# Patient Record
Sex: Female | Born: 1960 | Race: White | Hispanic: No | Marital: Single | Smoking: Current every day smoker
Health system: Southern US, Community
[De-identification: ages and names within clinical notes are randomized; demographics above are authoritative.]

## PROBLEM LIST (undated history)

## (undated) DIAGNOSIS — K589 Irritable bowel syndrome without diarrhea: Secondary | ICD-10-CM

## (undated) DIAGNOSIS — F410 Panic disorder [episodic paroxysmal anxiety] without agoraphobia: Secondary | ICD-10-CM

## (undated) DIAGNOSIS — K219 Gastro-esophageal reflux disease without esophagitis: Secondary | ICD-10-CM

## (undated) HISTORY — DX: Gastro-esophageal reflux disease without esophagitis: K21.9

## (undated) HISTORY — DX: Irritable bowel syndrome without diarrhea: K58.9

## (undated) HISTORY — PX: DILATION AND CURETTAGE, DIAGNOSTIC / THERAPEUTIC: SUR384

---

## 1998-09-04 ENCOUNTER — Encounter: Payer: Self-pay | Admitting: Obstetrics & Gynecology

## 1998-09-04 ENCOUNTER — Ambulatory Visit (HOSPITAL_COMMUNITY): Admission: RE | Admit: 1998-09-04 | Discharge: 1998-09-04 | Payer: Self-pay | Admitting: Obstetrics & Gynecology

## 1999-03-10 ENCOUNTER — Other Ambulatory Visit: Admission: RE | Admit: 1999-03-10 | Discharge: 1999-03-10 | Payer: Self-pay | Admitting: Obstetrics & Gynecology

## 1999-09-17 ENCOUNTER — Inpatient Hospital Stay (HOSPITAL_COMMUNITY): Admission: AD | Admit: 1999-09-17 | Discharge: 1999-09-19 | Payer: Self-pay | Admitting: Obstetrics & Gynecology

## 2002-01-31 ENCOUNTER — Other Ambulatory Visit: Admission: RE | Admit: 2002-01-31 | Discharge: 2002-01-31 | Payer: Self-pay | Admitting: Obstetrics and Gynecology

## 2003-01-29 ENCOUNTER — Other Ambulatory Visit: Admission: RE | Admit: 2003-01-29 | Discharge: 2003-01-29 | Payer: Self-pay | Admitting: Obstetrics & Gynecology

## 2004-04-03 ENCOUNTER — Other Ambulatory Visit: Admission: RE | Admit: 2004-04-03 | Discharge: 2004-04-03 | Payer: Self-pay | Admitting: Obstetrics & Gynecology

## 2005-04-08 ENCOUNTER — Other Ambulatory Visit: Admission: RE | Admit: 2005-04-08 | Discharge: 2005-04-08 | Payer: Self-pay | Admitting: Obstetrics & Gynecology

## 2016-02-18 ENCOUNTER — Emergency Department (HOSPITAL_BASED_OUTPATIENT_CLINIC_OR_DEPARTMENT_OTHER)
Admission: EM | Admit: 2016-02-18 | Discharge: 2016-02-18 | Disposition: A | Discharge status: Home / Self Care | Payer: 59 | Attending: Emergency Medicine | Admitting: Emergency Medicine

## 2016-02-18 ENCOUNTER — Encounter (HOSPITAL_BASED_OUTPATIENT_CLINIC_OR_DEPARTMENT_OTHER): Payer: Self-pay | Admitting: Emergency Medicine

## 2016-02-18 ENCOUNTER — Emergency Department (HOSPITAL_BASED_OUTPATIENT_CLINIC_OR_DEPARTMENT_OTHER): Payer: 59

## 2016-02-18 DIAGNOSIS — M549 Dorsalgia, unspecified: Secondary | ICD-10-CM | POA: Diagnosis present

## 2016-02-18 DIAGNOSIS — M546 Pain in thoracic spine: Secondary | ICD-10-CM | POA: Diagnosis not present

## 2016-02-18 DIAGNOSIS — F17200 Nicotine dependence, unspecified, uncomplicated: Secondary | ICD-10-CM | POA: Insufficient documentation

## 2016-02-18 DIAGNOSIS — R1013 Epigastric pain: Secondary | ICD-10-CM | POA: Insufficient documentation

## 2016-02-18 HISTORY — DX: Panic disorder (episodic paroxysmal anxiety): F41.0

## 2016-02-18 LAB — CBC WITH DIFFERENTIAL/PLATELET
Band Neutrophils: 3 %
Basophils Absolute: 0 10*3/uL (ref 0.0–0.1)
Basophils Relative: 0 %
Blasts: 0 %
Eosinophils Absolute: 0.6 10*3/uL (ref 0.0–0.7)
Eosinophils Relative: 8 %
HCT: 34.7 % — ABNORMAL LOW (ref 36.0–46.0)
Hemoglobin: 11.7 g/dL — ABNORMAL LOW (ref 12.0–15.0)
Lymphocytes Relative: 43 %
Lymphs Abs: 3.6 10*3/uL (ref 0.7–4.0)
MCH: 33.1 pg (ref 26.0–34.0)
MCHC: 33.7 g/dL (ref 30.0–36.0)
MCV: 98 fL (ref 78.0–100.0)
Metamyelocytes Relative: 0 %
Monocytes Absolute: 0.6 10*3/uL (ref 0.1–1.0)
Monocytes Relative: 8 %
Myelocytes: 0 %
Neutro Abs: 3.3 10*3/uL (ref 1.7–7.7)
Neutrophils Relative %: 38 %
Other: 0 %
Platelets: 282 10*3/uL (ref 150–400)
Promyelocytes Absolute: 0 %
RBC: 3.54 MIL/uL — ABNORMAL LOW (ref 3.87–5.11)
RDW: 12.3 % (ref 11.5–15.5)
WBC: 8.1 10*3/uL (ref 4.0–10.5)
nRBC: 0 /100 WBC

## 2016-02-18 LAB — COMPREHENSIVE METABOLIC PANEL
ALT: 21 U/L (ref 14–54)
AST: 27 U/L (ref 15–41)
Albumin: 4 g/dL (ref 3.5–5.0)
Alkaline Phosphatase: 53 U/L (ref 38–126)
Anion gap: 8 (ref 5–15)
BUN: 15 mg/dL (ref 6–20)
CO2: 25 mmol/L (ref 22–32)
Calcium: 8.8 mg/dL — ABNORMAL LOW (ref 8.9–10.3)
Chloride: 107 mmol/L (ref 101–111)
Creatinine, Ser: 0.75 mg/dL (ref 0.44–1.00)
GFR calc Af Amer: >60 mL/min (ref >60)
GFR calc non Af Amer: >60 mL/min (ref >60)
Glucose, Bld: 102 mg/dL — ABNORMAL HIGH (ref 65–99)
Potassium: 3.6 mmol/L (ref 3.5–5.1)
Sodium: 140 mmol/L (ref 135–145)
Total Bilirubin: 0.4 mg/dL (ref 0.3–1.2)
Total Protein: 7.2 g/dL (ref 6.5–8.1)

## 2016-02-18 LAB — URINALYSIS, ROUTINE W REFLEX MICROSCOPIC
Bilirubin Urine: NEGATIVE
Glucose, UA: NEGATIVE mg/dL
Ketones, ur: NEGATIVE mg/dL
Leukocytes, UA: NEGATIVE
Nitrite: NEGATIVE
Protein, ur: NEGATIVE mg/dL
Specific Gravity, Urine: 1.019 (ref 1.005–1.030)
pH: 5.5 (ref 5.0–8.0)

## 2016-02-18 LAB — LIPASE, BLOOD: Lipase: 33 U/L (ref 11–51)

## 2016-02-18 LAB — TROPONIN I: Troponin I: <0.03 ng/mL (ref <0.03)

## 2016-02-18 LAB — URINE MICROSCOPIC-ADD ON

## 2016-02-18 MED ORDER — GI COCKTAIL ~~LOC~~
30.0000 mL | Freq: Once | ORAL | Status: AC
  Administered 2016-02-18: 30 mL via ORAL
  Filled 2016-02-18: qty 30

## 2016-02-18 MED ORDER — NAPROXEN 250 MG PO TABS: 500.0000 mg | ORAL_TABLET | Freq: Once | ORAL | Status: DC

## 2016-02-18 MED ORDER — NAPROXEN 500 MG PO TABS: 500.0000 mg | ORAL_TABLET | Freq: Two times a day (BID) | ORAL | 0 refills | Status: AC

## 2016-02-18 NOTE — ED Triage Notes (Signed)
Patient reports that she has had some mid back pain tonight since 9 pm . She took an antacid because she thought it may have been related to reflux. Patient states that she has had a very stressful day and is tearful with anxiety related to the day and the worry from the pain. The patient reports that she had panic disorder.

## 2016-02-18 NOTE — ED Provider Notes (Signed)
[facility] Provider Note   CSN: [id number] Arrival date & time: 02/18/16  1191  First Provider Contact:  First MD Initiated Contact with Patient 02/18/16 0240        History   Chief Complaint Chief Complaint  Patient presents with  . Back Pain    HPI Judith Black is a 55 y.o. female.   Back Pain   Associated symptoms include abdominal pain. Pertinent negatives include no chest pain, no fever, no numbness, no dysuria and no weakness.    This is a 55 year old female who presents with abdominal and back pain. Patient reports that she woke up and had slight epigastric abdominal pain. Then she noted pain across her back over the bilateral thorax. Current pain is 4 out of 10. She's not taken anything for the pain. She looked up her symptoms on the Internet and was concerned for possible pancreatic cancer. She has several friends with cancer and has panic disorder and this really concerned her. She has had similar back pain in the past. She relates this to her posture at work and sitting at a desk. She denies any chest pain or shortness of breath. She denies any nausea, vomiting, diarrhea. She currently denies any abdominal pain. Pain did not come after eating.  Past Medical History:  Diagnosis Date  . Panic disorder     There are no active problems to display for this patient.   History reviewed. No pertinent surgical history.  OB History    No data available       Home Medications    Prior to Admission medications   Medication Sig Start Date End Date Taking? Authorizing Provider  busPIRone (BUSPAR) 5 MG tablet Take 5 mg by mouth 3 (three) times daily.   Yes Historical Provider, MD  PARoxetine (PAXIL-CR) 25 MG 24 hr tablet Take 25 mg by mouth daily.   Yes Historical Provider, MD  naproxen (NAPROSYN) 500 MG tablet Take 1 tablet (500 mg total) by mouth 2 (two) times daily. 02/18/16   Shon Baton, MD    Family History History reviewed. No  pertinent family history.  Social History Social History  Substance Use Topics  . Smoking status: Current Every Day Smoker  . Smokeless tobacco: Never Used  . Alcohol use No     Allergies   Review of patient's allergies indicates no known allergies.   Review of Systems Review of Systems  Constitutional: Negative for fever.  Respiratory: Negative for shortness of breath.   Cardiovascular: Negative for chest pain.  Gastrointestinal: Positive for abdominal pain. Negative for diarrhea, nausea and vomiting.  Genitourinary: Negative for dysuria and hematuria.  Musculoskeletal: Positive for back pain. Negative for gait problem.  Neurological: Negative for weakness and numbness.  All other systems reviewed and are negative.    Physical Exam Updated Vital Signs BP 128/82 (BP Location: Right Arm)   Pulse 75   Temp 98.3 F (36.8 C) (Oral)   Resp 18   SpO2 100%   Physical Exam  Constitutional: She is oriented to person, place, and time. She appears well-developed and well-nourished.  Anxious appearing  HENT:  Head: Normocephalic and atraumatic.  Cardiovascular: Normal rate, regular rhythm and normal heart sounds.   Pulmonary/Chest: Effort normal and breath sounds normal. No respiratory distress. She has no wheezes.  Abdominal: Soft. Bowel sounds are normal. There is no tenderness. There is no guarding.  Musculoskeletal:  Mild tenderness palpation across the thoracic paraspinous muscle region, no midline tenderness step-off,  or deformity  Neurological: She is alert and oriented to person, place, and time.  Skin: Skin is warm and dry.  Psychiatric: She has a normal mood and affect.  Nursing note and vitals reviewed.    ED Treatments / Results  Labs (all labs ordered are listed, but only abnormal results are displayed) Labs Reviewed  CBC WITH DIFFERENTIAL/PLATELET - Abnormal; Notable for the following:       Result Value   RBC 3.54 (*)    Hemoglobin 11.7 (*)    HCT 34.7  (*)    All other components within normal limits  COMPREHENSIVE METABOLIC PANEL - Abnormal; Notable for the following:    Glucose, Bld 102 (*)    Calcium 8.8 (*)    All other components within normal limits  URINALYSIS, ROUTINE W REFLEX MICROSCOPIC (NOT AT [facility]) - Abnormal; Notable for the following:    APPearance CLOUDY (*)    Hgb urine dipstick LARGE (*)    All other components within normal limits  URINE MICROSCOPIC-ADD ON - Abnormal; Notable for the following:    Squamous Epithelial / LPF 6-30 (*)    Bacteria, UA FEW (*)    All other components within normal limits  TROPONIN I  LIPASE, BLOOD    EKG  EKG Interpretation  Date/Time:  Wednesday February 18 2016 02:56:07 EDT Ventricular Rate:  72 PR Interval:    QRS Duration: 78 QT Interval:  396 QTC Calculation: 434 R Axis:   71 Text Interpretation:  Sinus rhythm Confirmed by Wilkie Aye  MD, Jaliana Medellin ([id number]) on 02/18/2016 3:44:27 AM       Radiology Dg Abd Acute W/chest  Result Date: 02/18/2016 CLINICAL DATA:  55 year old female with mid back pain EXAM: DG ABDOMEN ACUTE W/ 1V CHEST COMPARISON:  Abdominal CT dated 07/25/2014 FINDINGS: The lungs are clear. There is no pleural effusion or pneumothorax. The cardiac silhouette is within normal limits. No acute osseous pathology. There is no evidence of bowel obstruction. No free air. A 5 mm linear density over the right renal silhouette may represent a renal stone versus costochondral calcification. No acute osseous pathology. IMPRESSION: Negative abdominal radiographs.  No acute cardiopulmonary disease. Electronically Signed   By: Elgie Collard M.D.   On: 02/18/2016 03:32   Procedures Procedures (including critical care time)  Medications Ordered in ED Medications  naproxen (NAPROSYN) tablet 500 mg (not administered)  gi cocktail (Maalox,Lidocaine,Donnatal) (30 mLs Oral Given 02/18/16 0350)     Initial Impression / Assessment and Plan / ED Course  I have reviewed the triage  vital signs and the nursing notes.  Pertinent labs & imaging results that were available during my care of the patient were reviewed by me and considered in my medical decision making (see chart for details).  Clinical Course    Patient presents with abdominal and back pain. Nontoxic-appearing. States that she is very anxious after undergoing her symptoms. No reproducible abdominal pain on exam. Mild tenderness bilateral thoracic paraspinous muscle region. Considerations include GERD, heart pathology, musculoskeletal, pancreatitis. Lab work was obtained. EKG reassuring. All lab work is reassuring. Patient was given a GI cocktail with no significant improvement of her pain. She was subsequently given naproxen. Pain may be related to musculoskeletal etiology.  We will place on naproxen twice a day. Follow-up with primary physician if symptoms persist.  Final Clinical Impressions(s) / ED Diagnoses   Final diagnoses:  Bilateral thoracic back pain    New Prescriptions New Prescriptions   NAPROXEN (NAPROSYN) 500 MG TABLET  Take 1 tablet (500 mg total) by mouth 2 (two) times daily.     Shon Baton, MD 02/18/16 [phone]

## 2016-02-18 NOTE — ED Notes (Signed)
Back from xray

## 2016-02-18 NOTE — Discharge Instructions (Signed)
You were seen today for abdominal back pain. The cause of your pain at this time is unclear. It is likely musculoskeletal in nature and may be related to your posturing at work.  All your lab work and heart tests are reassuring at this time. If pain continues, follow-up with your primary physician.

## 2016-08-19 DIAGNOSIS — F39 Unspecified mood [affective] disorder: Secondary | ICD-10-CM | POA: Diagnosis not present

## 2016-08-19 DIAGNOSIS — F4001 Agoraphobia with panic disorder: Secondary | ICD-10-CM | POA: Diagnosis not present

## 2016-09-14 DIAGNOSIS — R5383 Other fatigue: Secondary | ICD-10-CM | POA: Diagnosis not present

## 2016-10-20 DIAGNOSIS — D538 Other specified nutritional anemias: Secondary | ICD-10-CM | POA: Diagnosis not present

## 2016-11-23 DIAGNOSIS — E784 Other hyperlipidemia: Secondary | ICD-10-CM | POA: Diagnosis not present

## 2016-11-23 DIAGNOSIS — D538 Other specified nutritional anemias: Secondary | ICD-10-CM | POA: Diagnosis not present

## 2016-11-23 DIAGNOSIS — M810 Age-related osteoporosis without current pathological fracture: Secondary | ICD-10-CM | POA: Diagnosis not present

## 2016-11-26 ENCOUNTER — Other Ambulatory Visit: Payer: Self-pay | Admitting: Internal Medicine

## 2016-11-26 DIAGNOSIS — E559 Vitamin D deficiency, unspecified: Secondary | ICD-10-CM | POA: Diagnosis not present

## 2016-11-26 DIAGNOSIS — Z720 Tobacco use: Secondary | ICD-10-CM

## 2016-11-26 DIAGNOSIS — R3121 Asymptomatic microscopic hematuria: Secondary | ICD-10-CM | POA: Diagnosis not present

## 2016-11-26 DIAGNOSIS — E538 Deficiency of other specified B group vitamins: Secondary | ICD-10-CM | POA: Diagnosis not present

## 2016-11-26 DIAGNOSIS — M810 Age-related osteoporosis without current pathological fracture: Secondary | ICD-10-CM | POA: Diagnosis not present

## 2016-11-26 DIAGNOSIS — Z0000 Encounter for general adult medical examination without abnormal findings: Secondary | ICD-10-CM | POA: Diagnosis not present

## 2016-11-26 DIAGNOSIS — Z1389 Encounter for screening for other disorder: Secondary | ICD-10-CM | POA: Diagnosis not present

## 2016-11-29 ENCOUNTER — Ambulatory Visit: Payer: 59

## 2016-11-29 DIAGNOSIS — Z1212 Encounter for screening for malignant neoplasm of rectum: Secondary | ICD-10-CM | POA: Diagnosis not present

## 2016-12-09 ENCOUNTER — Inpatient Hospital Stay
Admission: RE | Admit: 2016-12-09 | Discharge: 2016-12-09 | Disposition: A | Discharge status: Home / Self Care | Payer: 59 | Source: Ambulatory Visit | Attending: Internal Medicine | Admitting: Internal Medicine

## 2017-02-08 DIAGNOSIS — M50322 Other cervical disc degeneration at C5-C6 level: Secondary | ICD-10-CM | POA: Diagnosis not present

## 2017-02-08 DIAGNOSIS — M50321 Other cervical disc degeneration at C4-C5 level: Secondary | ICD-10-CM | POA: Diagnosis not present

## 2017-02-08 DIAGNOSIS — M9901 Segmental and somatic dysfunction of cervical region: Secondary | ICD-10-CM | POA: Diagnosis not present

## 2017-02-08 DIAGNOSIS — M50323 Other cervical disc degeneration at C6-C7 level: Secondary | ICD-10-CM | POA: Diagnosis not present

## 2017-02-09 DIAGNOSIS — F39 Unspecified mood [affective] disorder: Secondary | ICD-10-CM | POA: Diagnosis not present

## 2017-02-09 DIAGNOSIS — F4001 Agoraphobia with panic disorder: Secondary | ICD-10-CM | POA: Diagnosis not present

## 2017-02-14 DIAGNOSIS — M50323 Other cervical disc degeneration at C6-C7 level: Secondary | ICD-10-CM | POA: Diagnosis not present

## 2017-02-14 DIAGNOSIS — M50322 Other cervical disc degeneration at C5-C6 level: Secondary | ICD-10-CM | POA: Diagnosis not present

## 2017-02-14 DIAGNOSIS — M50321 Other cervical disc degeneration at C4-C5 level: Secondary | ICD-10-CM | POA: Diagnosis not present

## 2017-02-14 DIAGNOSIS — M9901 Segmental and somatic dysfunction of cervical region: Secondary | ICD-10-CM | POA: Diagnosis not present

## 2017-02-16 DIAGNOSIS — M50322 Other cervical disc degeneration at C5-C6 level: Secondary | ICD-10-CM | POA: Diagnosis not present

## 2017-02-16 DIAGNOSIS — M9901 Segmental and somatic dysfunction of cervical region: Secondary | ICD-10-CM | POA: Diagnosis not present

## 2017-02-16 DIAGNOSIS — M50321 Other cervical disc degeneration at C4-C5 level: Secondary | ICD-10-CM | POA: Diagnosis not present

## 2017-02-16 DIAGNOSIS — M50323 Other cervical disc degeneration at C6-C7 level: Secondary | ICD-10-CM | POA: Diagnosis not present

## 2017-02-22 DIAGNOSIS — M50322 Other cervical disc degeneration at C5-C6 level: Secondary | ICD-10-CM | POA: Diagnosis not present

## 2017-02-22 DIAGNOSIS — M50321 Other cervical disc degeneration at C4-C5 level: Secondary | ICD-10-CM | POA: Diagnosis not present

## 2017-02-22 DIAGNOSIS — M50323 Other cervical disc degeneration at C6-C7 level: Secondary | ICD-10-CM | POA: Diagnosis not present

## 2017-02-22 DIAGNOSIS — M9901 Segmental and somatic dysfunction of cervical region: Secondary | ICD-10-CM | POA: Diagnosis not present

## 2017-02-24 DIAGNOSIS — M9901 Segmental and somatic dysfunction of cervical region: Secondary | ICD-10-CM | POA: Diagnosis not present

## 2017-02-24 DIAGNOSIS — M50321 Other cervical disc degeneration at C4-C5 level: Secondary | ICD-10-CM | POA: Diagnosis not present

## 2017-02-24 DIAGNOSIS — M50322 Other cervical disc degeneration at C5-C6 level: Secondary | ICD-10-CM | POA: Diagnosis not present

## 2017-02-24 DIAGNOSIS — M50323 Other cervical disc degeneration at C6-C7 level: Secondary | ICD-10-CM | POA: Diagnosis not present

## 2017-03-10 ENCOUNTER — Other Ambulatory Visit: Payer: Self-pay | Admitting: Internal Medicine

## 2017-03-10 DIAGNOSIS — R519 Headache, unspecified: Secondary | ICD-10-CM

## 2017-03-10 DIAGNOSIS — M542 Cervicalgia: Secondary | ICD-10-CM

## 2017-03-10 DIAGNOSIS — R51 Headache: Principal | ICD-10-CM

## 2017-03-14 ENCOUNTER — Other Ambulatory Visit: Payer: Self-pay | Admitting: Internal Medicine

## 2017-03-14 DIAGNOSIS — R51 Headache: Principal | ICD-10-CM

## 2017-03-14 DIAGNOSIS — R519 Headache, unspecified: Secondary | ICD-10-CM

## 2017-03-14 DIAGNOSIS — M542 Cervicalgia: Secondary | ICD-10-CM

## 2017-03-23 ENCOUNTER — Ambulatory Visit
Admission: RE | Admit: 2017-03-23 | Discharge: 2017-03-23 | Disposition: A | Discharge status: Home / Self Care | Payer: BLUE CROSS/BLUE SHIELD | Source: Ambulatory Visit | Attending: Internal Medicine | Admitting: Internal Medicine

## 2017-03-23 DIAGNOSIS — R51 Headache: Principal | ICD-10-CM

## 2017-03-23 DIAGNOSIS — R519 Headache, unspecified: Secondary | ICD-10-CM

## 2017-03-23 DIAGNOSIS — M542 Cervicalgia: Secondary | ICD-10-CM

## 2017-03-23 DIAGNOSIS — M50223 Other cervical disc displacement at C6-C7 level: Secondary | ICD-10-CM | POA: Diagnosis not present

## 2017-05-03 DIAGNOSIS — G935 Compression of brain: Secondary | ICD-10-CM | POA: Diagnosis not present

## 2017-06-15 DIAGNOSIS — D329 Benign neoplasm of meninges, unspecified: Secondary | ICD-10-CM | POA: Diagnosis not present

## 2017-06-15 DIAGNOSIS — G935 Compression of brain: Secondary | ICD-10-CM | POA: Diagnosis not present

## 2017-08-31 DIAGNOSIS — Z01419 Encounter for gynecological examination (general) (routine) without abnormal findings: Secondary | ICD-10-CM | POA: Diagnosis not present

## 2017-08-31 DIAGNOSIS — Z6820 Body mass index (BMI) 20.0-20.9, adult: Secondary | ICD-10-CM | POA: Diagnosis not present

## 2017-08-31 DIAGNOSIS — Z1231 Encounter for screening mammogram for malignant neoplasm of breast: Secondary | ICD-10-CM | POA: Diagnosis not present

## 2017-09-07 DIAGNOSIS — M810 Age-related osteoporosis without current pathological fracture: Secondary | ICD-10-CM | POA: Diagnosis not present

## 2017-11-21 DIAGNOSIS — R197 Diarrhea, unspecified: Secondary | ICD-10-CM | POA: Diagnosis not present

## 2017-11-21 DIAGNOSIS — K219 Gastro-esophageal reflux disease without esophagitis: Secondary | ICD-10-CM | POA: Diagnosis not present

## 2017-12-21 DIAGNOSIS — G939 Disorder of brain, unspecified: Secondary | ICD-10-CM | POA: Diagnosis not present

## 2017-12-21 DIAGNOSIS — G935 Compression of brain: Secondary | ICD-10-CM | POA: Diagnosis not present

## 2017-12-21 DIAGNOSIS — D329 Benign neoplasm of meninges, unspecified: Secondary | ICD-10-CM | POA: Diagnosis not present

## 2018-03-22 DIAGNOSIS — M25519 Pain in unspecified shoulder: Secondary | ICD-10-CM | POA: Diagnosis not present

## 2018-03-22 DIAGNOSIS — R079 Chest pain, unspecified: Secondary | ICD-10-CM | POA: Diagnosis not present

## 2018-04-28 DIAGNOSIS — Z85828 Personal history of other malignant neoplasm of skin: Secondary | ICD-10-CM | POA: Diagnosis not present

## 2018-04-28 DIAGNOSIS — L821 Other seborrheic keratosis: Secondary | ICD-10-CM | POA: Diagnosis not present

## 2018-04-28 DIAGNOSIS — L820 Inflamed seborrheic keratosis: Secondary | ICD-10-CM | POA: Diagnosis not present

## 2018-04-28 DIAGNOSIS — L570 Actinic keratosis: Secondary | ICD-10-CM | POA: Diagnosis not present

## 2018-04-28 DIAGNOSIS — L814 Other melanin hyperpigmentation: Secondary | ICD-10-CM | POA: Diagnosis not present

## 2018-05-17 DIAGNOSIS — F17210 Nicotine dependence, cigarettes, uncomplicated: Secondary | ICD-10-CM | POA: Diagnosis not present

## 2018-05-17 DIAGNOSIS — F321 Major depressive disorder, single episode, moderate: Secondary | ICD-10-CM | POA: Diagnosis not present

## 2018-05-17 DIAGNOSIS — G43009 Migraine without aura, not intractable, without status migrainosus: Secondary | ICD-10-CM | POA: Diagnosis not present

## 2018-06-07 DIAGNOSIS — M79672 Pain in left foot: Secondary | ICD-10-CM | POA: Diagnosis not present

## 2018-07-31 DIAGNOSIS — M810 Age-related osteoporosis without current pathological fracture: Secondary | ICD-10-CM | POA: Diagnosis not present

## 2018-07-31 DIAGNOSIS — Z0000 Encounter for general adult medical examination without abnormal findings: Secondary | ICD-10-CM | POA: Diagnosis not present

## 2018-07-31 DIAGNOSIS — R82998 Other abnormal findings in urine: Secondary | ICD-10-CM | POA: Diagnosis not present

## 2018-07-31 DIAGNOSIS — E538 Deficiency of other specified B group vitamins: Secondary | ICD-10-CM | POA: Diagnosis not present

## 2018-07-31 DIAGNOSIS — R5383 Other fatigue: Secondary | ICD-10-CM | POA: Diagnosis not present

## 2018-07-31 DIAGNOSIS — E785 Hyperlipidemia, unspecified: Secondary | ICD-10-CM | POA: Diagnosis not present

## 2018-08-04 DIAGNOSIS — G935 Compression of brain: Secondary | ICD-10-CM | POA: Diagnosis not present

## 2018-08-04 DIAGNOSIS — Z1389 Encounter for screening for other disorder: Secondary | ICD-10-CM | POA: Diagnosis not present

## 2018-08-04 DIAGNOSIS — M542 Cervicalgia: Secondary | ICD-10-CM | POA: Diagnosis not present

## 2018-08-04 DIAGNOSIS — E559 Vitamin D deficiency, unspecified: Secondary | ICD-10-CM | POA: Diagnosis not present

## 2018-08-04 DIAGNOSIS — Z0000 Encounter for general adult medical examination without abnormal findings: Secondary | ICD-10-CM | POA: Diagnosis not present

## 2018-08-04 DIAGNOSIS — R51 Headache: Secondary | ICD-10-CM | POA: Diagnosis not present

## 2018-08-08 ENCOUNTER — Other Ambulatory Visit: Payer: Self-pay | Admitting: Internal Medicine

## 2018-08-08 DIAGNOSIS — F17200 Nicotine dependence, unspecified, uncomplicated: Secondary | ICD-10-CM

## 2018-08-17 ENCOUNTER — Ambulatory Visit
Admission: RE | Admit: 2018-08-17 | Discharge: 2018-08-17 | Disposition: A | Discharge status: Home / Self Care | Payer: BLUE CROSS/BLUE SHIELD | Source: Ambulatory Visit | Attending: Internal Medicine | Admitting: Internal Medicine

## 2018-08-17 DIAGNOSIS — F17200 Nicotine dependence, unspecified, uncomplicated: Secondary | ICD-10-CM

## 2018-08-17 DIAGNOSIS — Z87891 Personal history of nicotine dependence: Secondary | ICD-10-CM | POA: Diagnosis not present

## 2019-05-25 ENCOUNTER — Other Ambulatory Visit: Payer: Self-pay

## 2019-05-25 DIAGNOSIS — Z20822 Contact with and (suspected) exposure to covid-19: Secondary | ICD-10-CM

## 2019-05-26 LAB — NOVEL CORONAVIRUS, NAA: SARS-CoV-2, NAA: NOT DETECTED

## 2019-05-28 DIAGNOSIS — F17200 Nicotine dependence, unspecified, uncomplicated: Secondary | ICD-10-CM | POA: Diagnosis not present

## 2019-05-28 DIAGNOSIS — J441 Chronic obstructive pulmonary disease with (acute) exacerbation: Secondary | ICD-10-CM | POA: Diagnosis not present

## 2019-06-06 ENCOUNTER — Telehealth: Payer: Self-pay

## 2019-06-06 ENCOUNTER — Other Ambulatory Visit: Payer: Self-pay

## 2019-06-06 ENCOUNTER — Encounter: Payer: Self-pay | Admitting: Pulmonary Disease

## 2019-06-06 ENCOUNTER — Ambulatory Visit (INDEPENDENT_AMBULATORY_CARE_PROVIDER_SITE_OTHER): Payer: BC Managed Care – PPO | Admitting: Pulmonary Disease

## 2019-06-06 ENCOUNTER — Telehealth: Payer: Self-pay | Admitting: Pulmonary Disease

## 2019-06-06 VITALS — BP 134/80 | HR 81 | Temp 97.9°F | Ht 64.0 in | Wt 116.0 lb

## 2019-06-06 DIAGNOSIS — Z720 Tobacco use: Secondary | ICD-10-CM

## 2019-06-06 DIAGNOSIS — R0609 Other forms of dyspnea: Secondary | ICD-10-CM

## 2019-06-06 DIAGNOSIS — Z8709 Personal history of other diseases of the respiratory system: Secondary | ICD-10-CM

## 2019-06-06 DIAGNOSIS — R0600 Dyspnea, unspecified: Secondary | ICD-10-CM | POA: Diagnosis not present

## 2019-06-06 MED ORDER — ALBUTEROL SULFATE HFA 108 (90 BASE) MCG/ACT IN AERS: 1.0000 | INHALATION_SPRAY | Freq: Four times a day (QID) | RESPIRATORY_TRACT | 6 refills | Status: AC | PRN

## 2019-06-06 MED ORDER — ANORO ELLIPTA 62.5-25 MCG/INH IN AEPB: 1.0000 | INHALATION_SPRAY | Freq: Every day | RESPIRATORY_TRACT | 0 refills | Status: DC

## 2019-06-06 NOTE — Progress Notes (Signed)
Synopsis: Referred in November 2020 for COPD by Crist Infante, MD  Subjective:   PATIENT ID: Judith Black GENDER: female DOB: May 17, 1961, MRN: [id number]  Chief Complaint  Patient presents with  . Consult    Consult for SOB and cough. CT 1.23. Recent CXR. She reports  a productive cough with yellow mucous.     58 yo PMH tobacco abuse, smoked since age 83, no prior PFTs, never told she had COPD. Also has upper back pains. She has episodes of GERD. She takes a PPI currently.  She states a few weeks ago developed symptoms of bronchitis.  She was treated with antibiotics and steroids.  She had a chest x-ray after symptoms prolonged for a few weeks.  She was told that the chest x-ray was concerning for COPD.  Other than that she does not have the report with her.  She has not had pulmonary function tests in the past.  She unfortunately continues to smoke approximately 1 pack/day.  She has a greater than 40-pack-year history of smoking.  Patient denies fevers chills night sweats weight loss.  She does have some sputum production on occasion.  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 Family: 2 daughters, divorced, takes care of elderly parents.    Past Medical History:  Diagnosis Date  . GERD (gastroesophageal reflux disease)   . IBS (irritable bowel syndrome)   . Panic disorder      Family History  Problem Relation Age of Onset  . Scoliosis Mother   . Prostate cancer Father      Past Surgical History:  Procedure Laterality Date  . DILATION AND CURETTAGE, DIAGNOSTIC / THERAPEUTIC      Social History   Socioeconomic History  . Marital status: Single    Spouse name: Not on file  . Number of children: Not on file  . Years of education: Not on file  . Highest education level: Not on file  Occupational History  . Not on file  Social Needs  . Financial resource strain: Not on file  . Food insecurity     Worry: Not on file    Inability: Not on file  . Transportation needs    Medical: Not on file    Non-medical: Not on file  Tobacco Use  . Smoking status: Current Every Day Smoker    Packs/day: 1.00    Types: Cigarettes  . Smokeless tobacco: Never Used  Substance and Sexual Activity  . Alcohol use: No  . Drug use: No  . Sexual activity: Not on file  Lifestyle  . Physical activity    Days per week: Not on file    Minutes per session: Not on file  . Stress: Not on file  Relationships  . Social Herbalist on phone: Not on file    Gets together: Not on file    Attends religious service: Not on file    Active member of club or organization: Not on file    Attends meetings of clubs or organizations: Not on file    Relationship status: Not on file  . Intimate partner violence    Fear of current or ex partner: Not on file    Emotionally abused: Not on file    Physically abused: Not on file    Forced sexual activity: Not on file  Other Topics Concern  . Not on file  Social History Narrative  Divorced, 2 children      No Known Allergies   Outpatient Medications Prior to Visit  Medication Sig Dispense Refill  . naproxen (NAPROSYN) 500 MG tablet Take 1 tablet (500 mg total) by mouth 2 (two) times daily. 30 tablet 0  . pantoprazole (PROTONIX) 40 MG tablet Take 40 mg by mouth daily.    Marland Kitchen. PARoxetine (PAXIL-CR) 25 MG 24 hr tablet Take 25 mg by mouth daily.    . busPIRone (BUSPAR) 5 MG tablet Take 5 mg by mouth 3 (three) times daily.     No facility-administered medications prior to visit.     Review of Systems  Constitutional: Negative for chills, fever, malaise/fatigue and weight loss.  HENT: Negative for hearing loss, sore throat and tinnitus.   Eyes: Negative for blurred vision and double vision.  Respiratory: Positive for cough and shortness of breath. Negative for hemoptysis, sputum production, wheezing and stridor.   Cardiovascular: Negative for chest pain,  palpitations, orthopnea, leg swelling and PND.  Gastrointestinal: Negative for abdominal pain, constipation, diarrhea, heartburn, nausea and vomiting.  Genitourinary: Negative for dysuria, hematuria and urgency.  Musculoskeletal: Negative for joint pain and myalgias.  Skin: Negative for itching and rash.  Neurological: Negative for dizziness, tingling, weakness and headaches.  Endo/Heme/Allergies: Negative for environmental allergies. Does not bruise/bleed easily.  Psychiatric/Behavioral: Negative for depression. The patient is not nervous/anxious and does not have insomnia.   All other systems reviewed and are negative.    Objective:  Physical Exam Vitals signs reviewed.  Constitutional:      General: She is not in acute distress.    Appearance: She is well-developed.  HENT:     Head: Normocephalic and atraumatic.     Mouth/Throat:     Pharynx: No oropharyngeal exudate.  Eyes:     Conjunctiva/sclera: Conjunctivae normal.     Pupils: Pupils are equal, round, and reactive to light.  Neck:     Vascular: No JVD.     Trachea: No tracheal deviation.     Comments: Loss of supraclavicular fat Cardiovascular:     Rate and Rhythm: Normal rate and regular rhythm.     Heart sounds: S1 normal and S2 normal.     Comments: Distant heart tones Pulmonary:     Effort: No tachypnea or accessory muscle usage.     Breath sounds: No stridor. Decreased breath sounds (throughout all lung fields) present. No wheezing, rhonchi or rales.     Comments: Diminished breath sounds bilaterally Abdominal:     General: Bowel sounds are normal. There is no distension.     Palpations: Abdomen is soft.     Tenderness: There is no abdominal tenderness.  Musculoskeletal:        General: Deformity (muscle wasting ) present.  Skin:    General: Skin is warm and dry.     Capillary Refill: Capillary refill takes less than 2 seconds.     Findings: No rash.  Neurological:     Mental Status: She is alert and  oriented to person, place, and time.  Psychiatric:        Behavior: Behavior normal.      Vitals:   06/06/19 0936  BP: 134/80  Pulse: 81  Temp: 97.9 F (36.6 C)  TempSrc: Temporal  SpO2: 98%  Weight: 116 lb (52.6 kg)  Height: 5\' 4"  (1.626 m)   98% on RA BMI Readings from Last 3 Encounters:  06/06/19 19.91 kg/m   Wt Readings from Last 3 Encounters:  06/06/19 116  lb (52.6 kg)     CBC    Component Value Date/Time   WBC 8.1 02/18/2016 0330   RBC 3.54 (L) 02/18/2016 0330   HGB 11.7 (L) 02/18/2016 0330   HCT 34.7 (L) 02/18/2016 0330   PLT 282 02/18/2016 0330   MCV 98.0 02/18/2016 0330   MCH 33.1 02/18/2016 0330   MCHC 33.7 02/18/2016 0330   RDW 12.3 02/18/2016 0330   LYMPHSABS 3.6 02/18/2016 0330   MONOABS 0.6 02/18/2016 0330   EOSABS 0.6 02/18/2016 0330   BASOSABS 0.0 02/18/2016 0330    Chest Imaging: January 2020: Lung cancer screening CT lung RADS 1 recommending 1 year follow-up.  Pulmonary Functions Testing Results: No flowsheet data found.  FeNO: None   Pathology: None   Echocardiogram: None   Heart Catheterization: None     Assessment & Plan:     ICD-10-CM   1. DOE (dyspnea on exertion)  R06.00 FULL - Pulmonary Function Test (LBPU)  2. Tobacco use  Z72.0 FULL - Pulmonary Function Test (LBPU)  3. History of URI (upper respiratory infection)  Z87.09     Discussion:  This is a 58 year old female with recent chest x-ray imaging, per patient report stating concerning for COPD changes, I do not have this report.  However we will request a copy of the report as well as images.  She has a greater than 40-pack-year history of smoking.  And overall likely has COPD.  She does not have any pulmonary function tests.  I do believe the symptoms that she experienced a few weeks ago was likely a exacerbation of underlying COPD.  Plan: We will complete full pulmonary function test This will unfortunately be delayed due to Covid as well as Covid testing that  needs to be done prior to this. During the meantime we will start her on albuterol as needed for shortness of breath and wheezing We will also start her on Anoro Ellipta for daily symptom management. Patient was given samples of this inhaler today and instructed how to use it. She is already part of lung cancer screening completed by her primary care doctor's office.  If at some point she would like to be enrolled in our lung cancer screening program here we would be more than happy to do so.  Greater than 50% of this patient's 45-minute office visit was been face-to-face discussing above recommendations and treatment plan as well as review of medical record.    Current Outpatient Medications:  .  naproxen (NAPROSYN) 500 MG tablet, Take 1 tablet (500 mg total) by mouth 2 (two) times daily., Disp: 30 tablet, Rfl: 0 .  pantoprazole (PROTONIX) 40 MG tablet, Take 40 mg by mouth daily., Disp: , Rfl:  .  PARoxetine (PAXIL-CR) 25 MG 24 hr tablet, Take 25 mg by mouth daily., Disp: , Rfl:  .  albuterol (VENTOLIN HFA) 108 (90 Base) MCG/ACT inhaler, Inhale 1-2 puffs into the lungs every 6 (six) hours as needed for wheezing or shortness of breath., Disp: 18 g, Rfl: 6   Josephine Igo, DO [facility] 06/06/2019 10:08 AM

## 2019-06-06 NOTE — Patient Instructions (Addendum)
Thank you for visiting Dr. Valeta Harms at [facility]. Today we recommend the following:  Orders Placed This Encounter  Procedures  . FULL - Pulmonary Function Test (LBPU)   Meds ordered this encounter  Medications  . albuterol (VENTOLIN HFA) 108 (90 Base) MCG/ACT inhaler    Sig: Inhale 1-2 puffs into the lungs every 6 (six) hours as needed for wheezing or shortness of breath.    Dispense:  18 g    Refill:  6   Start Anoro ellipta samples today  Start tapering method for smoking cessation   Return in about 8 weeks (around 08/01/2019).  You must quit smoking or vaping. This is the single most important thing that you can do to improve your lung health.   S = Set a quit date. T = Tell family, friends, and the people around you that you plan to quit. A = Anticipate or plan ahead for the tough times you'll face while quitting. R = Remove cigarettes and other tobacco products from your home, car, and work T = Talk to [place] about getting help to quit  If you need help feel free to reach out to our office, [place] [facility]: [phone], call 1-800-QUIT-NOW, or visit [email or web address]/.    Please do your part to reduce the spread of COVID-19.

## 2019-06-06 NOTE — Progress Notes (Signed)
Patient seen in the office today and instructed on use of Anoro.  Patient expressed understanding and demonstrated technique. 

## 2019-06-06 NOTE — Telephone Encounter (Signed)
Printed & placed in folder to be sched.  

## 2019-06-07 NOTE — Telephone Encounter (Signed)
error 

## 2019-06-18 ENCOUNTER — Telehealth: Payer: Self-pay | Admitting: Pulmonary Disease

## 2019-06-18 MED ORDER — ANORO ELLIPTA 62.5-25 MCG/INH IN AEPB: 1.0000 | INHALATION_SPRAY | Freq: Every day | RESPIRATORY_TRACT | 2 refills | Status: AC

## 2019-06-18 MED ORDER — ANORO ELLIPTA 62.5-25 MCG/INH IN AEPB: 1.0000 | INHALATION_SPRAY | Freq: Every day | RESPIRATORY_TRACT | 0 refills | Status: DC

## 2019-06-18 NOTE — Telephone Encounter (Signed)
Attempted to call pt but unable to reach. Left message for pt to return call. 

## 2019-06-18 NOTE — Telephone Encounter (Signed)
Pt returning call.  [phone].

## 2019-06-18 NOTE — Telephone Encounter (Signed)
Before calling pt, I called pharmacy to see if a PA was needed for Anoro.  Pharmacist verified that rx is covered by insurance but she has a high copay for inhaled meds. Called pt, advised her of this.  I placed Anoro samples, a copay card, and [place] patient assistance up front for pickup, and explained how to activate/use the copay card first, then fill out the assistance forms and return them to clinic if the copay card does not help with her cost.  Pt expressed understanding.  Nothing further needed at this time- will close encounter.

## 2019-06-18 NOTE — Telephone Encounter (Signed)
Spoke with the pt  She states asking if we can call in rx for Anoro  She states it does seem to help her SOB  Rx was sent to pharm  Nothing further needed

## 2019-06-26 DIAGNOSIS — H52223 Regular astigmatism, bilateral: Secondary | ICD-10-CM | POA: Diagnosis not present

## 2019-06-26 DIAGNOSIS — H524 Presbyopia: Secondary | ICD-10-CM | POA: Diagnosis not present

## 2019-06-26 DIAGNOSIS — Z135 Encounter for screening for eye and ear disorders: Secondary | ICD-10-CM | POA: Diagnosis not present

## 2019-06-26 DIAGNOSIS — H5203 Hypermetropia, bilateral: Secondary | ICD-10-CM | POA: Diagnosis not present

## 2019-06-29 ENCOUNTER — Telehealth: Payer: Self-pay | Admitting: Pulmonary Disease

## 2019-06-29 NOTE — Telephone Encounter (Signed)
PCCM: Yes, please continue Anoro until PFTs complete. We will reassess after PFTs. Ok to sent prescription to pharmacy.   [place] for [employer] with me or APP following PFT completion  Garner Nash, DO [facility] 06/29/2019 1:07 PM

## 2019-06-29 NOTE — Telephone Encounter (Signed)
Called pt and advised message from the provider. Pt understood and verbalized understanding. Nothing further is needed.    

## 2019-06-29 NOTE — Telephone Encounter (Signed)
Spoke with pt, she would like to know if she should continue the Anoro because she feels better now. She has a PFT scheduled on 08/01/2018 but is waiting to schedule a f/u with BI. Please advise.   Patient Instructions by Garner Nash, DO at 06/06/2019 9:30 AM Author: Garner Nash, DO Author Type: Physician Filed: 06/06/2019 10:15 AM  Note Status: Addendum Cosign: Cosign Not Required Encounter Date: 06/06/2019  Editor: Garner Nash, DO (Physician)  Prior Versions: 1. Garner Nash, DO (Physician) at 06/06/2019 10:15 AM - Signed    Thank you for visiting Dr. Valeta Harms at [facility]. Today we recommend the following:     Orders Placed This Encounter  Procedures  . FULL - Pulmonary Function Test (LBPU)       Meds ordered this encounter  Medications  . albuterol (VENTOLIN HFA) 108 (90 Base) MCG/ACT inhaler    Sig: Inhale 1-2 puffs into the lungs every 6 (six) hours as needed for wheezing or shortness of breath.    Dispense:  18 g    Refill:  6   Start Anoro ellipta samples today  Start tapering method for smoking cessation   Return in about 8 weeks (around 08/01/2019).  You must quit smoking or vaping. This is the single most important

## 2019-07-09 ENCOUNTER — Telehealth: Payer: Self-pay | Admitting: Pulmonary Disease

## 2019-07-09 NOTE — Telephone Encounter (Signed)
Left message for patient to call back  

## 2019-07-10 NOTE — Telephone Encounter (Signed)
Spoke with pt, aware of recs.  Nothing further needed at this time- will close encounter.   

## 2019-07-10 NOTE — Telephone Encounter (Signed)
Called and spoke to pt. Pt states she was started on Anoro at last OV 06/06/2019 and began having diarrhea and heartburn. Pt states she took the Anoro for about 2-3 weeks and then stopped and noticed the symptoms subsided. Pt states she has been off the Anoro for about 3-4 weeks and hasnt had any GI symptoms, pt has taken twice in the 3-4 weeks and both times she had diarrhea and heartburn. Pt is scheduled for PFT and OV with TP on 08/01/2018.   Dr. Valeta Harms please advise. Thanks.

## 2019-07-10 NOTE — Telephone Encounter (Signed)
PCCM:  Ok to hold off until patient seen by [place], DO [facility] 07/10/2019 1:33 PM

## 2019-07-25 ENCOUNTER — Ambulatory Visit: Payer: BLUE CROSS/BLUE SHIELD | Attending: Internal Medicine

## 2019-07-25 DIAGNOSIS — Z20822 Contact with and (suspected) exposure to covid-19: Secondary | ICD-10-CM

## 2019-07-25 DIAGNOSIS — Z20828 Contact with and (suspected) exposure to other viral communicable diseases: Secondary | ICD-10-CM | POA: Insufficient documentation

## 2019-07-26 LAB — NOVEL CORONAVIRUS, NAA: SARS-CoV-2, NAA: NOT DETECTED

## 2019-07-30 ENCOUNTER — Other Ambulatory Visit (HOSPITAL_COMMUNITY)
Admission: RE | Admit: 2019-07-30 | Discharge: 2019-07-30 | Disposition: A | Discharge status: Home / Self Care | Payer: BLUE CROSS/BLUE SHIELD | Source: Ambulatory Visit | Attending: Pulmonary Disease | Admitting: Pulmonary Disease

## 2019-07-30 DIAGNOSIS — Z20822 Contact with and (suspected) exposure to covid-19: Secondary | ICD-10-CM | POA: Diagnosis not present

## 2019-07-30 DIAGNOSIS — Z01812 Encounter for preprocedural laboratory examination: Secondary | ICD-10-CM | POA: Insufficient documentation

## 2019-07-31 LAB — NOVEL CORONAVIRUS, NAA (HOSP ORDER, SEND-OUT TO REF LAB; TAT 18-24 HRS): SARS-CoV-2, NAA: NOT DETECTED

## 2019-08-02 ENCOUNTER — Ambulatory Visit: Payer: BC Managed Care – PPO | Admitting: Pulmonary Disease

## 2019-08-02 ENCOUNTER — Ambulatory Visit: Payer: BC Managed Care – PPO | Admitting: Adult Health

## 2019-08-21 DIAGNOSIS — E7849 Other hyperlipidemia: Secondary | ICD-10-CM | POA: Diagnosis not present

## 2019-08-21 DIAGNOSIS — M810 Age-related osteoporosis without current pathological fracture: Secondary | ICD-10-CM | POA: Diagnosis not present

## 2019-08-21 DIAGNOSIS — E538 Deficiency of other specified B group vitamins: Secondary | ICD-10-CM | POA: Diagnosis not present

## 2019-08-21 DIAGNOSIS — Z0000 Encounter for general adult medical examination without abnormal findings: Secondary | ICD-10-CM | POA: Diagnosis not present

## 2019-08-28 DIAGNOSIS — Z0000 Encounter for general adult medical examination without abnormal findings: Secondary | ICD-10-CM | POA: Diagnosis not present

## 2019-08-28 DIAGNOSIS — J441 Chronic obstructive pulmonary disease with (acute) exacerbation: Secondary | ICD-10-CM | POA: Diagnosis not present

## 2019-08-28 DIAGNOSIS — E559 Vitamin D deficiency, unspecified: Secondary | ICD-10-CM | POA: Diagnosis not present

## 2019-08-28 DIAGNOSIS — G935 Compression of brain: Secondary | ICD-10-CM | POA: Diagnosis not present

## 2019-08-28 DIAGNOSIS — J439 Emphysema, unspecified: Secondary | ICD-10-CM | POA: Diagnosis not present

## 2019-08-28 DIAGNOSIS — Z1389 Encounter for screening for other disorder: Secondary | ICD-10-CM | POA: Diagnosis not present

## 2019-08-28 DIAGNOSIS — Z20828 Contact with and (suspected) exposure to other viral communicable diseases: Secondary | ICD-10-CM | POA: Diagnosis not present

## 2019-08-28 DIAGNOSIS — Z1331 Encounter for screening for depression: Secondary | ICD-10-CM | POA: Diagnosis not present

## 2019-09-05 ENCOUNTER — Other Ambulatory Visit: Payer: Self-pay | Admitting: Internal Medicine

## 2019-09-05 DIAGNOSIS — F17200 Nicotine dependence, unspecified, uncomplicated: Secondary | ICD-10-CM

## 2019-09-17 ENCOUNTER — Other Ambulatory Visit (HOSPITAL_COMMUNITY)
Admission: RE | Admit: 2019-09-17 | Discharge: 2019-09-17 | Disposition: A | Discharge status: Home / Self Care | Payer: BLUE CROSS/BLUE SHIELD | Source: Ambulatory Visit | Attending: Pulmonary Disease | Admitting: Pulmonary Disease

## 2019-09-17 DIAGNOSIS — Z20822 Contact with and (suspected) exposure to covid-19: Secondary | ICD-10-CM | POA: Insufficient documentation

## 2019-09-17 DIAGNOSIS — Z01812 Encounter for preprocedural laboratory examination: Secondary | ICD-10-CM | POA: Insufficient documentation

## 2019-09-17 LAB — SARS CORONAVIRUS 2 (TAT 6-24 HRS): SARS Coronavirus 2: NEGATIVE

## 2019-09-20 ENCOUNTER — Encounter: Payer: Self-pay | Admitting: Adult Health

## 2019-09-20 ENCOUNTER — Ambulatory Visit: Payer: BLUE CROSS/BLUE SHIELD

## 2019-09-20 ENCOUNTER — Other Ambulatory Visit: Payer: Self-pay

## 2019-09-20 ENCOUNTER — Ambulatory Visit (INDEPENDENT_AMBULATORY_CARE_PROVIDER_SITE_OTHER): Payer: BLUE CROSS/BLUE SHIELD | Admitting: Adult Health

## 2019-09-20 DIAGNOSIS — R05 Cough: Secondary | ICD-10-CM

## 2019-09-20 DIAGNOSIS — R0600 Dyspnea, unspecified: Secondary | ICD-10-CM

## 2019-09-20 DIAGNOSIS — R0609 Other forms of dyspnea: Secondary | ICD-10-CM

## 2019-09-20 DIAGNOSIS — R0602 Shortness of breath: Secondary | ICD-10-CM

## 2019-09-20 DIAGNOSIS — Z720 Tobacco use: Secondary | ICD-10-CM | POA: Diagnosis not present

## 2019-09-20 DIAGNOSIS — R059 Cough, unspecified: Secondary | ICD-10-CM

## 2019-09-20 LAB — PULMONARY FUNCTION TEST
DL/VA % pred: 84 %
DL/VA: 3.56 ml/min/mmHg/L
DLCO cor % pred: 75 %
DLCO cor: 15.62 ml/min/mmHg
DLCO unc % pred: 75 %
DLCO unc: 15.72 ml/min/mmHg
FEF 25-75 Post: 2.53 L/sec
FEF 25-75 Pre: 2.02 L/sec
FEF2575-%Change-Post: 25 %
FEF2575-%Pred-Post: 101 %
FEF2575-%Pred-Pre: 80 %
FEV1-%Change-Post: 5 %
FEV1-%Pred-Post: 99 %
FEV1-%Pred-Pre: 94 %
FEV1-Post: 2.68 L
FEV1-Pre: 2.55 L
FEV1FVC-%Change-Post: 0 %
FEV1FVC-%Pred-Pre: 95 %
FEV6-%Change-Post: 2 %
FEV6-%Pred-Post: 103 %
FEV6-%Pred-Pre: 100 %
FEV6-Post: 3.44 L
FEV6-Pre: 3.35 L
FEV6FVC-%Change-Post: 0 %
FEV6FVC-%Pred-Post: 102 %
FEV6FVC-%Pred-Pre: 101 %
FVC-%Change-Post: 4 %
FVC-%Pred-Post: 103 %
FVC-%Pred-Pre: 98 %
FVC-Post: 3.56 L
FVC-Pre: 3.4 L
Post FEV1/FVC ratio: 75 %
Post FEV6/FVC ratio: 99 %
Pre FEV1/FVC ratio: 75 %
Pre FEV6/FVC Ratio: 98 %
RV % pred: 84 %
RV: 1.65 L
TLC % pred: 95 %
TLC: 4.9 L

## 2019-09-20 NOTE — Assessment & Plan Note (Signed)
Smoking cessation discussed. Continue with yearly CT screening program

## 2019-09-20 NOTE — Assessment & Plan Note (Addendum)
Patient was seen for pulmonary consult November 2020.  Most likely had a post viral cough from recent bronchitis.  This seems to have resolved totally.  PFTs today do not show any evidence of airflow obstruction.  CT chest January 2020 showed subtle changes of possible emphysema.  Patient has no significant restriction on PFTs.  O2 saturations are adequate.  Has a very slight diffusing defect. Patient is recommended to use albuterol as needed.  Continue activity as tolerated.  Most important goal currently is to quit smoking. Continue to follow-up with yearly CT screening program.

## 2019-09-20 NOTE — Progress Notes (Signed)
@Patient  ID: Judith Black, female    DOB: 06/18/1961, 59 y.o.   MRN: [id number]  Chief Complaint  Patient presents with  . Follow-up    cough Brett Fairy     Referring provider: Crist Infante, MD  HPI: 59 year old female active smoker seen for pulmonary consult June 06, 2019 for cough and shortness of breath  TEST/EVENTS :  Low-dose CT chest January 2020 showed biapical pleural parenchymal scarring.  Subtle changes of emphysema.  No suspicious nodules  09/20/2019 Follow up : Cough/Emphysema  Patient returns for a 2-month follow-up.  Patient was seen last visit for a pulmonary consult for ongoing cough and shortness of breath after suspected slow to resolve bronchitis.  COVID-19 testing was negative.  Patient is an active smoker.  Last visit patient was given a trial of Anoro.  Patient says she was unable to tolerate this.  Since last visit patient says her cough and shortness of breath has resolved.  She has albuterol but does not use this.  She says she remains active.  She has cut back on smoking in fact she is cut down to half of what she was smoking before.  And is working towards quitting.  Smoking cessation discussed with patient patient underwent pulmonary function testing today that showed normal lung function with no airflow obstruction or restriction.  FEV1 was 99%, ratio 75, FVC 103%, no significant bronchodilator response.  Positive mid flow reversibility.  DLCO 75%. Patient works full-time.  Feels that she does not have any activity limitations.. She denies any chest pain orthopnea PND or hemoptysis.  No Known Allergies   There is no immunization history on file for this patient.  Past Medical History:  Diagnosis Date  . GERD (gastroesophageal reflux disease)   . IBS (irritable bowel syndrome)   . Panic disorder     Tobacco History: Social History   Tobacco Use  Smoking Status Current Every Day Smoker  . Packs/day: 1.00  . Types: Cigarettes  Smokeless  Tobacco Never Used  Tobacco Comment   down to 1/2ppd since 06/06/19   Ready to quit: No Counseling given: Yes Comment: down to 1/2ppd since 06/06/19   Outpatient Medications Prior to Visit  Medication Sig Dispense Refill  . naproxen (NAPROSYN) 500 MG tablet Take 1 tablet (500 mg total) by mouth 2 (two) times daily. 30 tablet 0  . pantoprazole (PROTONIX) 40 MG tablet Take 40 mg by mouth daily.    Marland Kitchen PARoxetine (PAXIL-CR) 25 MG 24 hr tablet Take 25 mg by mouth daily.    Marland Kitchen albuterol (VENTOLIN HFA) 108 (90 Base) MCG/ACT inhaler Inhale 1-2 puffs into the lungs every 6 (six) hours as needed for wheezing or shortness of breath. (Patient not taking: Reported on 09/20/2019) 18 g 6  . umeclidinium-vilanterol (ANORO ELLIPTA) 62.5-25 MCG/INH AEPB Inhale 1 puff into the lungs daily. (Patient not taking: Reported on 09/20/2019) 60 each 2  . umeclidinium-vilanterol (ANORO ELLIPTA) 62.5-25 MCG/INH AEPB Inhale 1 puff into the lungs daily. (Patient not taking: Reported on 09/20/2019) 2 each 0  . umeclidinium-vilanterol (ANORO ELLIPTA) 62.5-25 MCG/INH AEPB Inhale 1 puff into the lungs daily. (Patient not taking: Reported on 09/20/2019) 2 each 0   No facility-administered medications prior to visit.     Review of Systems:   Constitutional:   No  weight loss, night sweats,  Fevers, chills, fatigue, or  lassitude.  HEENT:   No headaches,  Difficulty swallowing,  Tooth/dental problems, or  Sore throat,  No sneezing, itching, ear ache, nasal congestion, post nasal drip,   CV:  No chest pain,  Orthopnea, PND, swelling in lower extremities, anasarca, dizziness, palpitations, syncope.   GI  No heartburn, indigestion, abdominal pain, nausea, vomiting, diarrhea, change in bowel habits, loss of appetite, bloody stools.   Resp: No shortness of breath with exertion or at rest.  No excess mucus, no productive cough,  No non-productive cough,  No coughing up of blood.  No change in color of mucus.  No  wheezing.  No chest wall deformity  Skin: no rash or lesions.  GU: no dysuria, change in color of urine, no urgency or frequency.  No flank pain, no hematuria   MS:  No joint pain or swelling.  No decreased range of motion.  No back pain.    Physical Exam  BP 124/64 (BP Location: Left Arm, Cuff Size: Normal)   Pulse 69   Temp 98.2 F (36.8 C) (Temporal)   Ht 5' 4.5" (1.638 m)   Wt 115 lb (52.2 kg)   SpO2 98% Comment: RA  BMI 19.43 kg/m   GEN: A/Ox3; pleasant , NAD, well nourished    HEENT:  [place]/AT,  NOSE-clear, THROAT-clear, no lesions, no postnasal drip or exudate noted.   NECK:  Supple w/ fair ROM; no JVD; normal carotid impulses w/o bruits; no thyromegaly or nodules palpated; no lymphadenopathy.    RESP  Clear  P & A; w/o, wheezes/ rales/ or rhonchi. no accessory muscle use, no dullness to percussion  CARD:  RRR, no m/r/g, no peripheral edema, pulses intact, no cyanosis or clubbing.  GI:   Soft & nt; nml bowel sounds; no organomegaly or masses detected.   Musco: Warm bil, no deformities or joint swelling noted.   Neuro: alert, no focal deficits noted.    Skin: Warm, no lesions or rashes    Lab Results:   BMET  BNP No results found for: BNP  ProBNP No results found for: PROBNP  Imaging: No results found.    PFT Results Latest Ref Rng & Units 09/20/2019  FVC-Pre L 3.40  FVC-Predicted Pre % 98  FVC-Post L 3.56  FVC-Predicted Post % 103  Pre FEV1/FVC % % 75  Post FEV1/FCV % % 75  FEV1-Pre L 2.55  FEV1-Predicted Pre % 94  FEV1-Post L 2.68  DLCO UNC% % 75  DLCO COR %Predicted % 84  TLC L 4.90  TLC % Predicted % 95  RV % Predicted % 84    No results found for: NITRICOXIDE      Assessment & Plan:   Cough Patient was seen for pulmonary consult November 2020.  Most likely had a post viral cough from recent bronchitis.  This seems to have resolved totally.  PFTs today do not show any evidence of airflow obstruction.  CT chest January 2020  showed subtle changes of possible emphysema.  Patient has no significant restriction on PFTs.  O2 saturations are adequate.  Has a very slight diffusing defect. Patient is recommended to use albuterol as needed.  Continue activity as tolerated.  Most important goal currently is to quit smoking. Continue to follow-up with yearly CT screening program.  Dyspnea Dyspnea resolved after clinical recovery from bronchitis.  Patient has no activity limitations.  Pulmonary function testing shows normal lung function.  O2 saturations are adequate.  Tobacco abuse Smoking cessation discussed. Continue with yearly CT screening program     Rubye Oaks, NP 09/20/2019

## 2019-09-20 NOTE — Assessment & Plan Note (Signed)
Dyspnea resolved after clinical recovery from bronchitis.  Patient has no activity limitations.  Pulmonary function testing shows normal lung function.  O2 saturations are adequate.

## 2019-09-20 NOTE — Patient Instructions (Addendum)
Ventolin inhaler as needed for wheezing shortness of breath Activity as tolerated Continue to cut back on smoking you are doing a great job Follow-up in 6 months with Icard and as needed Follow-up for your low-dose CT screening as discussed

## 2019-09-22 NOTE — Progress Notes (Signed)
PCCM: Thanks again for seeing her. I appreciate it. Glad to see her pfts are ok.  Josephine Igo, DO [facility] 09/22/2019 4:55 PM

## 2019-09-24 DIAGNOSIS — K649 Unspecified hemorrhoids: Secondary | ICD-10-CM | POA: Diagnosis not present

## 2019-09-24 DIAGNOSIS — A630 Anogenital (venereal) warts: Secondary | ICD-10-CM | POA: Diagnosis not present

## 2019-10-04 ENCOUNTER — Ambulatory Visit: Payer: BLUE CROSS/BLUE SHIELD | Attending: Internal Medicine

## 2019-10-04 DIAGNOSIS — Z23 Encounter for immunization: Secondary | ICD-10-CM

## 2019-10-04 NOTE — Progress Notes (Signed)
   Covid-19 Vaccination Clinic  Name:  Judith Black    MRN: [id number] DOB: [birth year]  10/04/2019  Judith Black was observed post Covid-19 immunization for 15 minutes without incident. She was provided with Vaccine Information Sheet and instruction to access the V-Safe system.   Judith Black was instructed to call 911 with any severe reactions post vaccine: Marland Kitchen Difficulty breathing  . Swelling of face and throat  . A fast heartbeat  . A bad rash all over body  . Dizziness and weakness   Immunizations Administered    Name Date Dose VIS Date Route   Pfizer COVID-19 Vaccine 10/04/2019  3:27 PM 0.3 mL 07/06/2019 Intramuscular   Manufacturer: [employer], [employer]   Lot: [id number]   NDC: [address]-3

## 2019-10-10 DIAGNOSIS — Z681 Body mass index (BMI) 19 or less, adult: Secondary | ICD-10-CM | POA: Diagnosis not present

## 2019-10-10 DIAGNOSIS — Z01419 Encounter for gynecological examination (general) (routine) without abnormal findings: Secondary | ICD-10-CM | POA: Diagnosis not present

## 2019-10-19 DIAGNOSIS — M810 Age-related osteoporosis without current pathological fracture: Secondary | ICD-10-CM | POA: Diagnosis not present

## 2019-10-29 ENCOUNTER — Ambulatory Visit: Payer: BC Managed Care – PPO | Attending: Internal Medicine

## 2019-10-29 DIAGNOSIS — Z23 Encounter for immunization: Secondary | ICD-10-CM

## 2019-10-29 NOTE — Progress Notes (Signed)
   Covid-19 Vaccination Clinic  Name:  Judith Black    MRN: [id number] DOB: 10/17/1960  10/29/2019  [name] was observed post Covid-19 immunization for 15 minutes without incident. She was provided with Vaccine Information Sheet and instruction to access the V-Safe system.   [name] was instructed to call 911 with any severe reactions post vaccine: Marland Kitchen Difficulty breathing  . Swelling of face and throat  . A fast heartbeat  . A bad rash all over body  . Dizziness and weakness   Immunizations Administered    Name Date Dose VIS Date Route   Pfizer COVID-19 Vaccine 10/29/2019  3:12 PM 0.3 mL 07/06/2019 Intramuscular   Manufacturer: [employer], [employer]   Lot: [id number]   NDC: [address]-8

## 2019-12-06 DIAGNOSIS — K625 Hemorrhage of anus and rectum: Secondary | ICD-10-CM | POA: Diagnosis not present

## 2019-12-06 DIAGNOSIS — R197 Diarrhea, unspecified: Secondary | ICD-10-CM | POA: Diagnosis not present

## 2019-12-31 DIAGNOSIS — Z03818 Encounter for observation for suspected exposure to other biological agents ruled out: Secondary | ICD-10-CM | POA: Diagnosis not present

## 2020-01-02 DIAGNOSIS — K648 Other hemorrhoids: Secondary | ICD-10-CM | POA: Diagnosis not present

## 2020-01-02 DIAGNOSIS — K625 Hemorrhage of anus and rectum: Secondary | ICD-10-CM | POA: Diagnosis not present

## 2020-01-02 DIAGNOSIS — R197 Diarrhea, unspecified: Secondary | ICD-10-CM | POA: Diagnosis not present

## 2020-03-12 DIAGNOSIS — M722 Plantar fascial fibromatosis: Secondary | ICD-10-CM | POA: Diagnosis not present

## 2020-03-12 DIAGNOSIS — M25571 Pain in right ankle and joints of right foot: Secondary | ICD-10-CM | POA: Diagnosis not present

## 2020-03-20 DIAGNOSIS — Z1231 Encounter for screening mammogram for malignant neoplasm of breast: Secondary | ICD-10-CM | POA: Diagnosis not present

## 2020-04-09 DIAGNOSIS — K589 Irritable bowel syndrome without diarrhea: Secondary | ICD-10-CM | POA: Diagnosis not present

## 2020-04-09 DIAGNOSIS — Z20828 Contact with and (suspected) exposure to other viral communicable diseases: Secondary | ICD-10-CM | POA: Diagnosis not present

## 2020-04-09 DIAGNOSIS — R0981 Nasal congestion: Secondary | ICD-10-CM | POA: Diagnosis not present

## 2020-04-29 DIAGNOSIS — M722 Plantar fascial fibromatosis: Secondary | ICD-10-CM | POA: Diagnosis not present

## 2020-05-28 DIAGNOSIS — G9389 Other specified disorders of brain: Secondary | ICD-10-CM | POA: Diagnosis not present

## 2020-05-28 DIAGNOSIS — G939 Disorder of brain, unspecified: Secondary | ICD-10-CM | POA: Diagnosis not present

## 2020-08-26 DIAGNOSIS — Z0000 Encounter for general adult medical examination without abnormal findings: Secondary | ICD-10-CM | POA: Diagnosis not present

## 2020-08-26 DIAGNOSIS — E785 Hyperlipidemia, unspecified: Secondary | ICD-10-CM | POA: Diagnosis not present

## 2020-08-26 DIAGNOSIS — E559 Vitamin D deficiency, unspecified: Secondary | ICD-10-CM | POA: Diagnosis not present

## 2020-08-26 DIAGNOSIS — E538 Deficiency of other specified B group vitamins: Secondary | ICD-10-CM | POA: Diagnosis not present

## 2020-08-29 DIAGNOSIS — R82998 Other abnormal findings in urine: Secondary | ICD-10-CM | POA: Diagnosis not present

## 2020-08-29 DIAGNOSIS — E785 Hyperlipidemia, unspecified: Secondary | ICD-10-CM | POA: Diagnosis not present

## 2020-08-29 DIAGNOSIS — Z23 Encounter for immunization: Secondary | ICD-10-CM | POA: Diagnosis not present

## 2020-08-29 DIAGNOSIS — Z0000 Encounter for general adult medical examination without abnormal findings: Secondary | ICD-10-CM | POA: Diagnosis not present

## 2020-08-29 DIAGNOSIS — D649 Anemia, unspecified: Secondary | ICD-10-CM | POA: Diagnosis not present

## 2020-08-29 DIAGNOSIS — Z1331 Encounter for screening for depression: Secondary | ICD-10-CM | POA: Diagnosis not present

## 2020-09-01 ENCOUNTER — Other Ambulatory Visit: Payer: Self-pay | Admitting: Internal Medicine

## 2020-09-01 DIAGNOSIS — E785 Hyperlipidemia, unspecified: Secondary | ICD-10-CM

## 2020-09-26 ENCOUNTER — Ambulatory Visit
Admission: RE | Admit: 2020-09-26 | Discharge: 2020-09-26 | Disposition: A | Discharge status: Home / Self Care | Payer: Self-pay | Source: Ambulatory Visit | Attending: Internal Medicine | Admitting: Internal Medicine

## 2020-09-26 DIAGNOSIS — E785 Hyperlipidemia, unspecified: Secondary | ICD-10-CM

## 2020-10-07 DIAGNOSIS — M9902 Segmental and somatic dysfunction of thoracic region: Secondary | ICD-10-CM | POA: Diagnosis not present

## 2020-10-07 DIAGNOSIS — M546 Pain in thoracic spine: Secondary | ICD-10-CM | POA: Diagnosis not present

## 2020-10-07 DIAGNOSIS — M9901 Segmental and somatic dysfunction of cervical region: Secondary | ICD-10-CM | POA: Diagnosis not present

## 2020-10-07 DIAGNOSIS — M542 Cervicalgia: Secondary | ICD-10-CM | POA: Diagnosis not present

## 2020-10-10 DIAGNOSIS — M542 Cervicalgia: Secondary | ICD-10-CM | POA: Diagnosis not present

## 2020-10-13 DIAGNOSIS — Z01419 Encounter for gynecological examination (general) (routine) without abnormal findings: Secondary | ICD-10-CM | POA: Diagnosis not present

## 2020-10-13 DIAGNOSIS — Z6820 Body mass index (BMI) 20.0-20.9, adult: Secondary | ICD-10-CM | POA: Diagnosis not present

## 2020-10-13 DIAGNOSIS — N950 Postmenopausal bleeding: Secondary | ICD-10-CM | POA: Diagnosis not present

## 2020-10-28 DIAGNOSIS — R3121 Asymptomatic microscopic hematuria: Secondary | ICD-10-CM | POA: Diagnosis not present

## 2020-10-28 DIAGNOSIS — N393 Stress incontinence (female) (male): Secondary | ICD-10-CM | POA: Diagnosis not present

## 2020-11-03 DIAGNOSIS — M542 Cervicalgia: Secondary | ICD-10-CM | POA: Diagnosis not present

## 2020-11-03 DIAGNOSIS — M50322 Other cervical disc degeneration at C5-C6 level: Secondary | ICD-10-CM | POA: Diagnosis not present

## 2020-11-10 DIAGNOSIS — M50322 Other cervical disc degeneration at C5-C6 level: Secondary | ICD-10-CM | POA: Diagnosis not present

## 2020-11-10 DIAGNOSIS — M542 Cervicalgia: Secondary | ICD-10-CM | POA: Diagnosis not present

## 2021-03-25 IMAGING — CT CT CARDIAC CORONARY ARTERY CALCIUM SCORE
3 series · 13 of 20 positions shown, 15 images · non-contrast
Comparison: Screening CT of the chest on 08/17/2018

CLINICAL DATA: 59-year-old Caucasian female with history of
smoking.

EXAM:
CT CARDIAC CORONARY ARTERY CALCIUM SCORE
TECHNIQUE: Non-contrast imaging through the heart was performed using
prospective ECG gating. Image post processing was performed on an
independent workstation, allowing for quantitative analysis of the
heart and coronary arteries. Note that this exam targets the heart
and the chest was not imaged in its entirety.

[Series 2: calcium scoring 2.00 qr36 bestdiast 70% hrt calciu · axial · 0.33mm/px · z∈[+1608,+1672]mm · 3 of 80 slices shown]
[im 16/80  vessel]
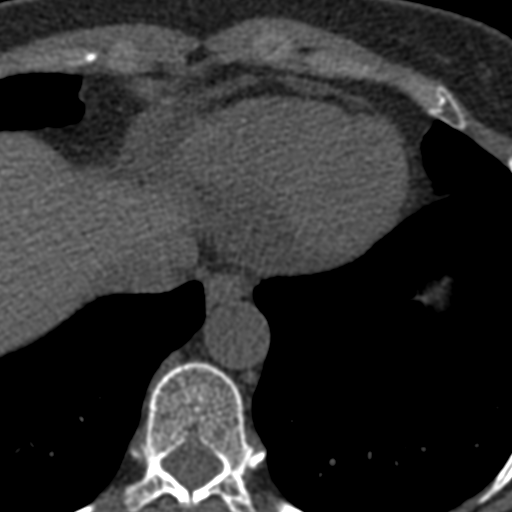
[im 32/80  vessel]
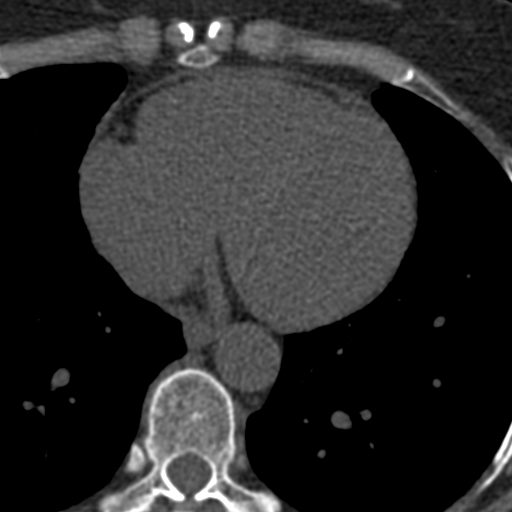
[im 48/80  vessel]
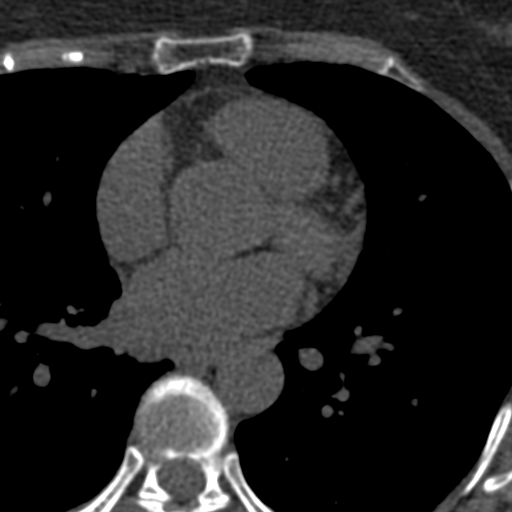

[Series 3: calcium scoring 2.00 br40 bestdiast 70% axial · axial · 0.45mm/px · z∈[+1604,+1708]mm · 5 of 80 slices shown, 7 images]
[im 14/80  vessel]
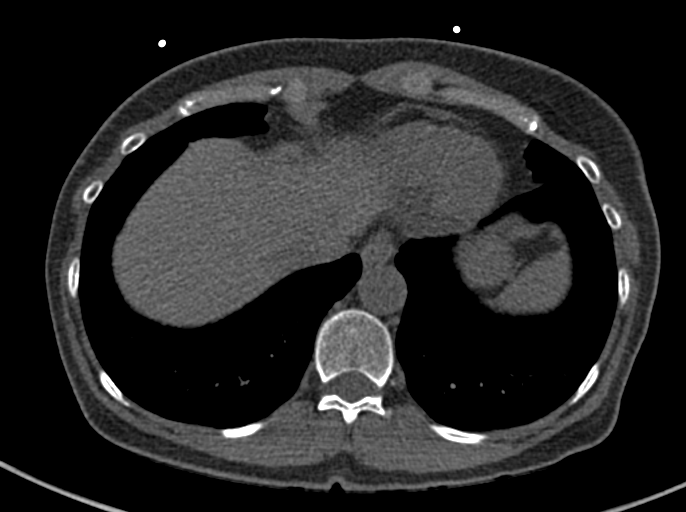
[im 14/80  lung]
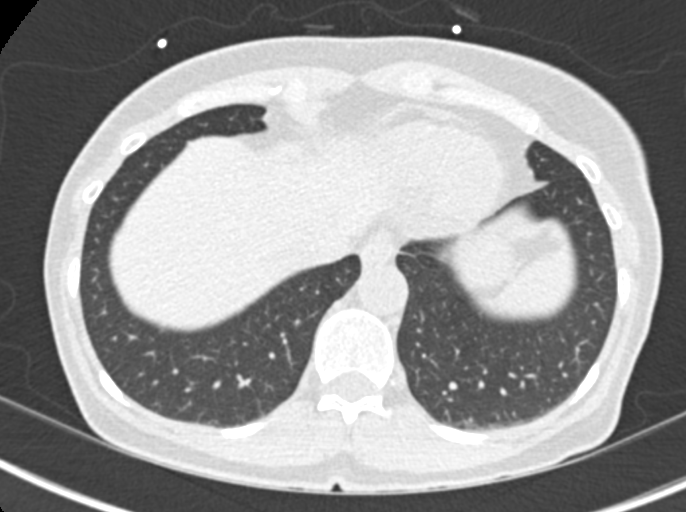
[im 27/80  vessel]
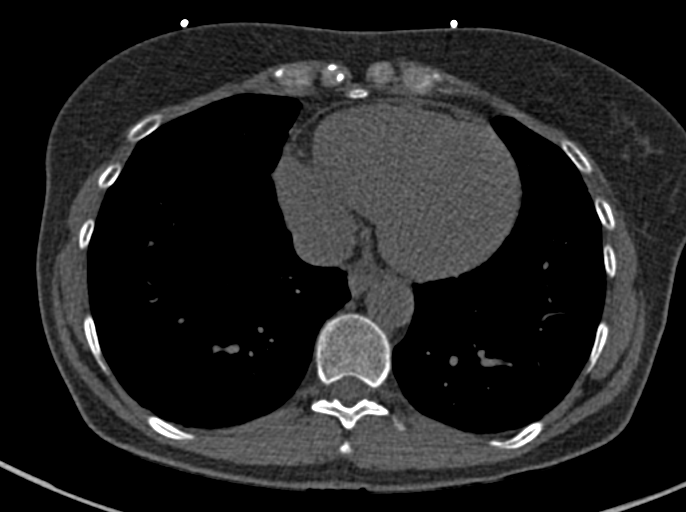
[im 40/80  vessel]
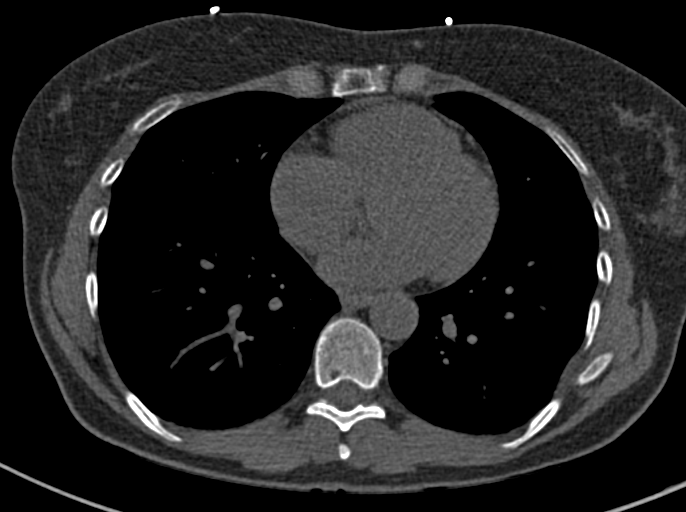
[im 53/80  vessel]
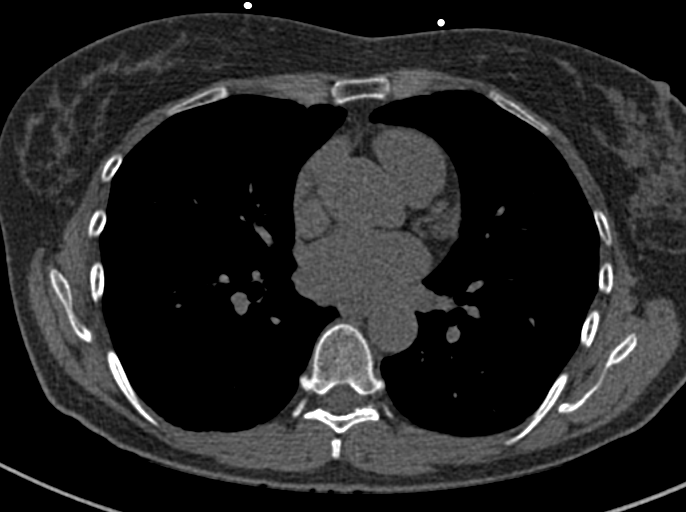
[im 66/80  vessel]
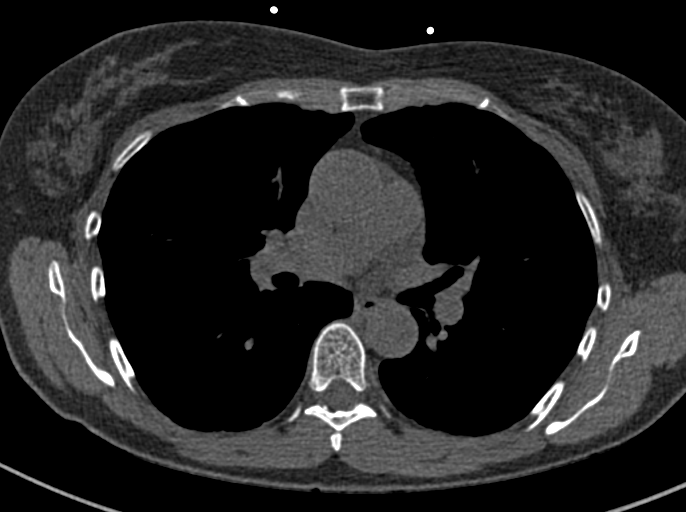
[im 66/80  lung]
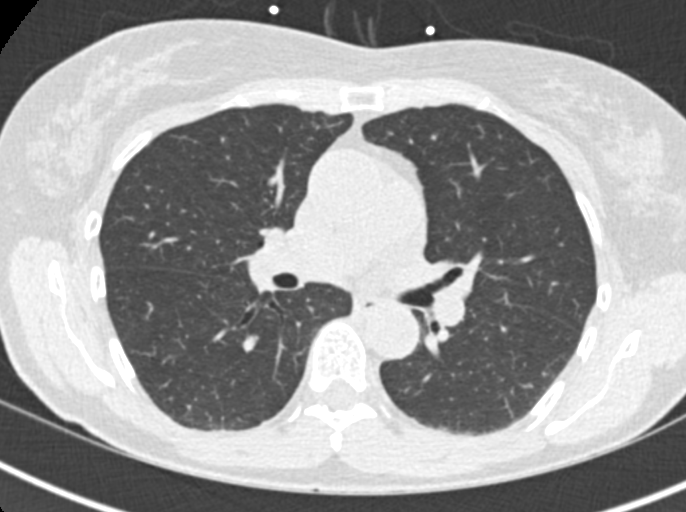

[Series 9: calcium scoring 2.00 br60 bestdiast 70% lungs · axial · 0.44mm/px · z∈[+1604,+1708]mm · 5 of 80 slices shown]
[im 14/80  vessel]
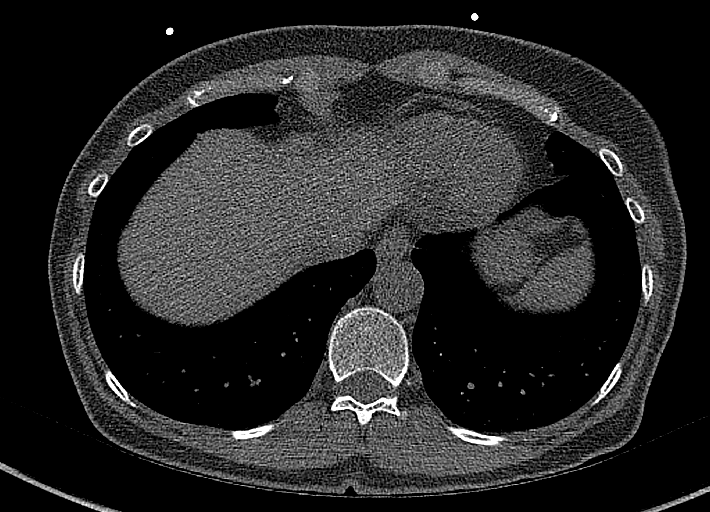
[im 27/80  vessel]
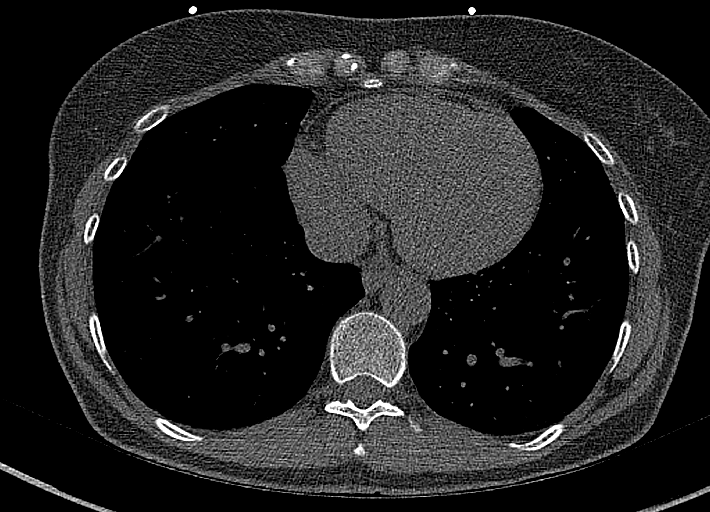
[im 40/80  vessel]
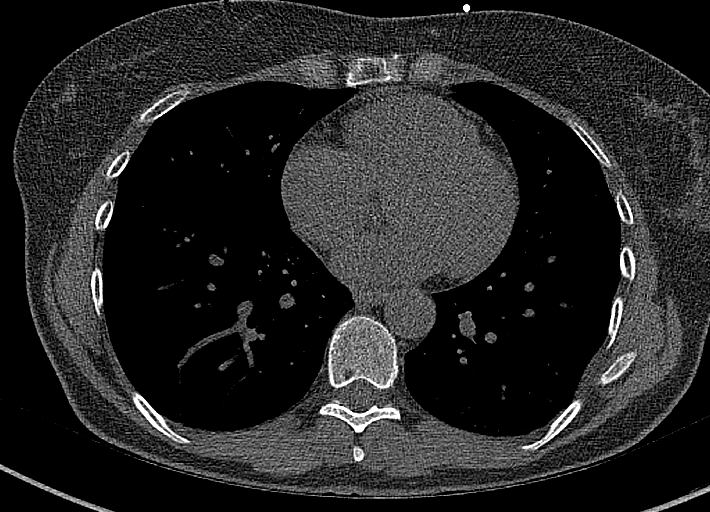
[im 53/80  vessel]
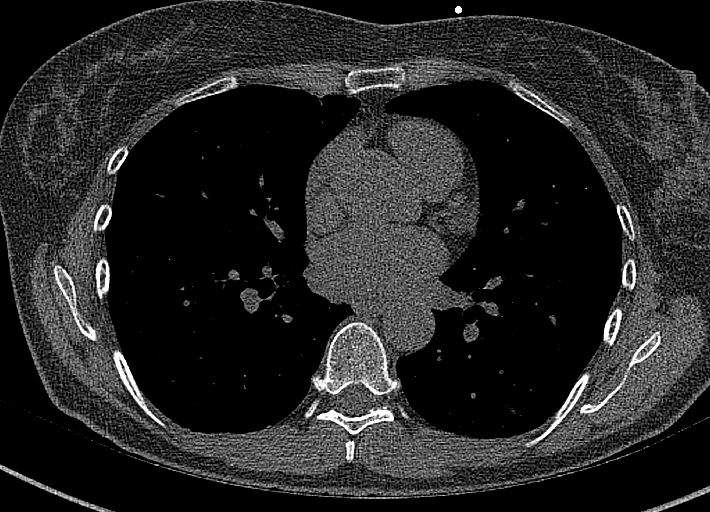
[im 66/80  vessel]
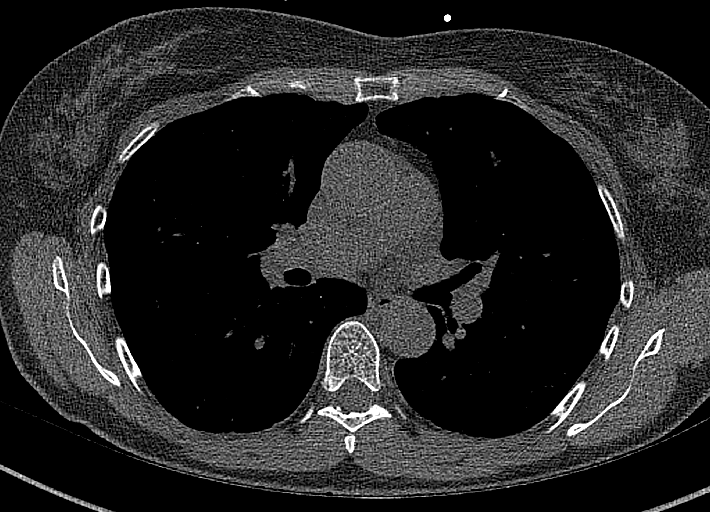

[13 of 20 positions shown; findings below may reference images not displayed]

FINDINGS: CORONARY CALCIUM SCORES:

Left Main: 0

LAD: 0

LCx: 0

RCA: 0

Total Agatston Score: 0

[HOSPITAL] percentile: 0

AORTA MEASUREMENTS:

Ascending Aorta: 32 mm

Descending Aorta: 21 mm

OTHER FINDINGS:

The heart size is within normal limits. Trace pericardial fluid is
likely physiologic. Visualized thoracic aorta and central pulmonary
arteries are normal in caliber. Visualized mediastinum and hilar
regions demonstrate no lymphadenopathy. Visualized lungs show no
evidence of pulmonary edema, consolidation, pneumothorax, nodule or
pleural fluid. Visualized upper abdomen and bony structures are
unremarkable.
IMPRESSION: Coronary calcium score of 0.

## 2021-05-06 DIAGNOSIS — M722 Plantar fascial fibromatosis: Secondary | ICD-10-CM | POA: Diagnosis not present

## 2021-05-13 DIAGNOSIS — M50322 Other cervical disc degeneration at C5-C6 level: Secondary | ICD-10-CM | POA: Diagnosis not present

## 2021-05-13 DIAGNOSIS — M542 Cervicalgia: Secondary | ICD-10-CM | POA: Diagnosis not present

## 2021-05-20 DIAGNOSIS — A630 Anogenital (venereal) warts: Secondary | ICD-10-CM | POA: Diagnosis not present

## 2021-06-02 DIAGNOSIS — R3121 Asymptomatic microscopic hematuria: Secondary | ICD-10-CM | POA: Diagnosis not present

## 2021-06-02 DIAGNOSIS — R634 Abnormal weight loss: Secondary | ICD-10-CM | POA: Diagnosis not present

## 2021-06-02 DIAGNOSIS — D649 Anemia, unspecified: Secondary | ICD-10-CM | POA: Diagnosis not present

## 2021-06-11 DIAGNOSIS — A630 Anogenital (venereal) warts: Secondary | ICD-10-CM | POA: Diagnosis not present

## 2021-07-14 DIAGNOSIS — C44622 Squamous cell carcinoma of skin of right upper limb, including shoulder: Secondary | ICD-10-CM | POA: Diagnosis not present

## 2021-08-17 DIAGNOSIS — Z79890 Hormone replacement therapy (postmenopausal): Secondary | ICD-10-CM | POA: Diagnosis not present

## 2021-09-29 DIAGNOSIS — E559 Vitamin D deficiency, unspecified: Secondary | ICD-10-CM | POA: Diagnosis not present

## 2021-09-29 DIAGNOSIS — D649 Anemia, unspecified: Secondary | ICD-10-CM | POA: Diagnosis not present

## 2021-09-29 DIAGNOSIS — E538 Deficiency of other specified B group vitamins: Secondary | ICD-10-CM | POA: Diagnosis not present

## 2021-09-30 DIAGNOSIS — D649 Anemia, unspecified: Secondary | ICD-10-CM | POA: Diagnosis not present

## 2021-10-02 DIAGNOSIS — Z1331 Encounter for screening for depression: Secondary | ICD-10-CM | POA: Diagnosis not present

## 2021-10-02 DIAGNOSIS — Z0000 Encounter for general adult medical examination without abnormal findings: Secondary | ICD-10-CM | POA: Diagnosis not present

## 2021-10-02 DIAGNOSIS — R82998 Other abnormal findings in urine: Secondary | ICD-10-CM | POA: Diagnosis not present

## 2021-10-02 DIAGNOSIS — Z1339 Encounter for screening examination for other mental health and behavioral disorders: Secondary | ICD-10-CM | POA: Diagnosis not present

## 2021-10-02 DIAGNOSIS — E559 Vitamin D deficiency, unspecified: Secondary | ICD-10-CM | POA: Diagnosis not present

## 2021-10-07 ENCOUNTER — Other Ambulatory Visit: Payer: Self-pay | Admitting: Internal Medicine

## 2021-10-07 DIAGNOSIS — F17200 Nicotine dependence, unspecified, uncomplicated: Secondary | ICD-10-CM

## 2021-10-16 ENCOUNTER — Telehealth: Payer: Self-pay | Admitting: Hematology

## 2021-10-16 NOTE — Telephone Encounter (Signed)
Scheduled appt per 03/24 referral. Pt is aware of appt date and time. Pt is aware to arrive 15 mins prior to appt time and to bring and updated insurance card. Pt is aware of appt location.   ?

## 2021-10-19 DIAGNOSIS — Z01419 Encounter for gynecological examination (general) (routine) without abnormal findings: Secondary | ICD-10-CM | POA: Diagnosis not present

## 2021-10-19 DIAGNOSIS — Z124 Encounter for screening for malignant neoplasm of cervix: Secondary | ICD-10-CM | POA: Diagnosis not present

## 2021-10-19 DIAGNOSIS — R87610 Atypical squamous cells of undetermined significance on cytologic smear of cervix (ASC-US): Secondary | ICD-10-CM | POA: Diagnosis not present

## 2021-10-19 DIAGNOSIS — Z681 Body mass index (BMI) 19 or less, adult: Secondary | ICD-10-CM | POA: Diagnosis not present

## 2021-10-27 DIAGNOSIS — M810 Age-related osteoporosis without current pathological fracture: Secondary | ICD-10-CM | POA: Diagnosis not present

## 2021-11-02 ENCOUNTER — Ambulatory Visit
Admission: RE | Admit: 2021-11-02 | Discharge: 2021-11-02 | Disposition: A | Discharge status: Home / Self Care | Payer: BC Managed Care – PPO | Source: Ambulatory Visit | Attending: Internal Medicine | Admitting: Internal Medicine

## 2021-11-02 DIAGNOSIS — F17200 Nicotine dependence, unspecified, uncomplicated: Secondary | ICD-10-CM

## 2021-11-02 DIAGNOSIS — F17210 Nicotine dependence, cigarettes, uncomplicated: Secondary | ICD-10-CM | POA: Diagnosis not present

## 2021-11-09 ENCOUNTER — Inpatient Hospital Stay: Payer: BC Managed Care – PPO

## 2021-11-09 ENCOUNTER — Other Ambulatory Visit: Payer: Self-pay

## 2021-11-09 ENCOUNTER — Inpatient Hospital Stay: Payer: BC Managed Care – PPO | Attending: Hematology | Admitting: Hematology

## 2021-11-09 VITALS — BP 137/80 | HR 75 | Temp 98.1°F | Resp 20 | Wt 116.7 lb

## 2021-11-09 DIAGNOSIS — D649 Anemia, unspecified: Secondary | ICD-10-CM | POA: Diagnosis not present

## 2021-11-09 DIAGNOSIS — E559 Vitamin D deficiency, unspecified: Secondary | ICD-10-CM | POA: Insufficient documentation

## 2021-11-09 LAB — CMP (CANCER CENTER ONLY)
ALT: 19 U/L (ref 0–44)
AST: 21 U/L (ref 15–41)
Albumin: 4.3 g/dL (ref 3.5–5.0)
Alkaline Phosphatase: 60 U/L (ref 38–126)
Anion gap: 6 (ref 5–15)
BUN: 14 mg/dL (ref 6–20)
CO2: 28 mmol/L (ref 22–32)
Calcium: 9.2 mg/dL (ref 8.9–10.3)
Chloride: 105 mmol/L (ref 98–111)
Creatinine: 0.74 mg/dL (ref 0.44–1.00)
GFR, Estimated: >60 mL/min (ref >60)
Glucose, Bld: 98 mg/dL (ref 70–99)
Potassium: 3.9 mmol/L (ref 3.5–5.1)
Sodium: 139 mmol/L (ref 135–145)
Total Bilirubin: 0.2 mg/dL — ABNORMAL LOW (ref 0.3–1.2)
Total Protein: 7.5 g/dL (ref 6.5–8.1)

## 2021-11-09 LAB — CBC WITH DIFFERENTIAL/PLATELET
Abs Immature Granulocytes: 0.09 10*3/uL — ABNORMAL HIGH (ref 0.00–0.07)
Basophils Absolute: 0.2 10*3/uL — ABNORMAL HIGH (ref 0.0–0.1)
Basophils Relative: 3 %
Eosinophils Absolute: 0.4 10*3/uL (ref 0.0–0.5)
Eosinophils Relative: 6 %
HCT: 34.2 % — ABNORMAL LOW (ref 36.0–46.0)
Hemoglobin: 11.5 g/dL — ABNORMAL LOW (ref 12.0–15.0)
Immature Granulocytes: 2 %
Lymphocytes Relative: 43 %
Lymphs Abs: 2.5 10*3/uL (ref 0.7–4.0)
MCH: 34.2 pg — ABNORMAL HIGH (ref 26.0–34.0)
MCHC: 33.6 g/dL (ref 30.0–36.0)
MCV: 101.8 fL — ABNORMAL HIGH (ref 80.0–100.0)
Monocytes Absolute: 0.4 10*3/uL (ref 0.1–1.0)
Monocytes Relative: 7 %
Neutro Abs: 2.3 10*3/uL (ref 1.7–7.7)
Neutrophils Relative %: 39 %
Platelets: 293 10*3/uL (ref 150–400)
RBC: 3.36 MIL/uL — ABNORMAL LOW (ref 3.87–5.11)
RDW: 12.8 % (ref 11.5–15.5)
WBC: 5.8 10*3/uL (ref 4.0–10.5)
nRBC: 0 % (ref 0.0–0.2)

## 2021-11-09 LAB — IRON AND IRON BINDING CAPACITY (CC-WL,HP ONLY)
Iron: 91 ug/dL (ref 28–170)
Saturation Ratios: 27 % (ref 10.4–31.8)
TIBC: 342 ug/dL (ref 250–450)
UIBC: 251 ug/dL (ref 148–442)

## 2021-11-09 LAB — SAVE SMEAR(SSMR), FOR PROVIDER SLIDE REVIEW

## 2021-11-09 LAB — SEDIMENTATION RATE: Sed Rate: 18 mm/hr (ref 0–22)

## 2021-11-09 LAB — LACTATE DEHYDROGENASE: LDH: 137 U/L (ref 98–192)

## 2021-11-09 LAB — VITAMIN B12: Vitamin B-12: 2299 pg/mL — ABNORMAL HIGH (ref 180–914)

## 2021-11-09 LAB — VITAMIN D 25 HYDROXY (VIT D DEFICIENCY, FRACTURES): Vit D, 25-Hydroxy: 28.82 ng/mL — ABNORMAL LOW (ref 30–100)

## 2021-11-09 LAB — T4, FREE: Free T4: 0.63 ng/dL (ref 0.61–1.12)

## 2021-11-09 LAB — FERRITIN: Ferritin: 23 ng/mL (ref 11–307)

## 2021-11-09 NOTE — Progress Notes (Signed)
. ? ? ?HEMATOLOGY/ONCOLOGY CONSULTATION NOTE ? ?Date of Service: 11/09/2021 ? ?Patient Care Team: ?Judith Black, Judith Black, Judith Black as PCP - General (Internal Medicine) ? ?CHIEF COMPLAINTS/PURPOSE OF CONSULTATION:  ?Anemia ? ?HISTORY OF PRESENTING ILLNESS:  ? ?Judith Black is a wonderful 61 y.o. female who has been referred to us by Dr .Judith Black, Judith Black, Judith Black ?for evaluation and management of anemia. ? ?Patient notes she has a history of irritable bowel syndrome, GERD, COPD/emphysema, osteoporosis and on recent labs with Dr. Waynard Black on 09/29/2021 -was noted to have a hemoglobin of 10.9 with an MCV of 101 and normal RDW, normal WBC count and platelets. ? ?Patient has had a previous history of B12 deficiency and was previously on parenteral vitamin B12 and is currently on B12 Gummies 1000 mcg daily. ?She notes that she has vitamin D deficiency and has been on vitamin D replacement recently. ? ?She notes that due to her irritable bowel syndrome he had decreased p.o. intake and has lost weight and was down from 121---111 lbs but is now back up to 115lbs ?Probiotics have helped with IBS. ?No previous history of thyroid disease ? ?Patient notes no new bone pains.  No fevers no chills no night sweats no unexpected weight loss. ?No recent new medications. ?We will be starting on Prolia for her osteoporosis. ?And has been on chronic PPI therapy for acid reflux. ?Patient notes she smokes a pack a day of cigarettes. ?Denies significant alcohol use or any other drug use. ? ?MEDICAL HISTORY:  ?Past Medical History:  ?Diagnosis Date  ? GERD (gastroesophageal reflux disease)   ? IBS (irritable bowel syndrome)   ? Panic disorder   ?-Osteoporosis ?-COPD/Emphysema ?-menopause 44 yrs-- kids at 61 yo . 61 y/o ? ?SURGICAL HISTORY: ?Past Surgical History:  ?Procedure Laterality Date  ? DILATION AND CURETTAGE, DIAGNOSTIC / THERAPEUTIC    ? ? ?SOCIAL HISTORY: ?Social History  ? ?Socioeconomic History  ? Marital status: Single  ?  Spouse name: Not on file  ?  Number of children: Not on file  ? Years of education: Not on file  ? Highest education level: Not on file  ?Occupational History  ? Not on file  ?Tobacco Use  ? Smoking status: Every Day  ?  Packs/day: 1.00  ?  Types: Cigarettes  ? Smokeless tobacco: Never  ? Tobacco comments:  ?  down to 1/2ppd since 06/06/19  ?Substance and Sexual Activity  ? Alcohol use: No  ? Drug use: No  ? Sexual activity: Not on file  ?Other Topics Concern  ? Not on file  ?Social History Narrative  ? Divorced, 2 children   ? ?Social Determinants of Health  ? ?Financial Resource Strain: Not on file  ?Food Insecurity: Not on file  ?Transportation Needs: Not on file  ?Physical Activity: Not on file  ?Stress: Not on file  ?Social Connections: Not on file  ?Intimate Partner Violence: Not on file  ? ? ?FAMILY HISTORY: ?Family History  ?Problem Relation Age of Onset  ? Scoliosis Mother   ? Prostate cancer Father   ? ? ?ALLERGIES:  has No Known Allergies. ? ?MEDICATIONS:  ?Current Outpatient Medications  ?Medication Sig Dispense Refill  ? albuterol (VENTOLIN HFA) 108 (90 Base) MCG/ACT inhaler Inhale 1-2 puffs into the lungs every 6 (six) hours as needed for wheezing or shortness of breath. (Patient not taking: Reported on 09/20/2019) 18 g 6  ? naproxen (NAPROSYN) 500 MG tablet Take 1 tablet (500 mg total) by mouth 2 (two) times daily.  30 tablet 0  ? pantoprazole (PROTONIX) 40 MG tablet Take 40 mg by mouth daily.    ? PARoxetine (PAXIL-CR) 25 MG 24 hr tablet Take 25 mg by mouth daily.    ? umeclidinium-vilanterol (ANORO ELLIPTA) 62.5-25 MCG/INH AEPB Inhale 1 puff into the lungs daily. (Patient not taking: Reported on 09/20/2019) 60 each 2  ? ?No current facility-administered medications for this visit.  ? ? ?REVIEW OF SYSTEMS:   ? ?10 Point review of Systems was done is negative except as noted above. ? ?PHYSICAL EXAMINATION: ?ECOG PERFORMANCE STATUS: 1 - Symptomatic but completely ambulatory ? ?. ?Vitals:  ? 11/09/21 1118  ?BP: 137/80  ?Pulse: 75   ?Resp: 20  ?Temp: 98.1 ?F (36.7 ?C)  ?SpO2: 100%  ? ?Filed Weights  ? 11/09/21 1118  ?Weight: 116 lb 11.2 oz (52.9 kg)  ? ?.Body mass index is 19.72 kg/m?. ? ?GENERAL:alert, in no acute distress and comfortable ?SKIN: no acute rashes, no significant lesions ?EYES: conjunctiva are pink and non-injected, sclera anicteric ?OROPHARYNX: MMM, no exudates, no oropharyngeal erythema or ulceration ?NECK: supple, no JVD ?LYMPH:  no palpable lymphadenopathy in the cervical, axillary or inguinal regions ?LUNGS: clear to auscultation b/l with normal respiratory effort ?HEART: regular rate & rhythm ?ABDOMEN:  normoactive bowel sounds , non tender, not distended.  No palpable hepatosplenomegaly. ?Extremity: no pedal edema ?PSYCH: alert & oriented x 3 with fluent speech ?NEURO: no focal motor/sensory deficits ? ?LABORATORY DATA:  ?I have reviewed the data as listed ? ?. ? ?  Latest Ref Rng & Units 02/18/2016  ?  3:30 AM  ?CBC  ?WBC 4.0 - 10.5 K/uL 8.1    ?Hemoglobin 12.0 - 15.0 g/dL 31.4    ?Hematocrit 36.0 - 46.0 % 34.7    ?Platelets 150 - 400 K/uL 282    ? ? ? ?  Latest Ref Rng & Units 02/18/2016  ?  3:30 AM  ?CMP  ?Glucose 65 - 99 mg/dL 970    ?BUN 6 - 20 mg/dL 15    ?Creatinine 0.44 - 1.00 mg/dL 2.63    ?Sodium 135 - 145 mmol/L 140    ?Potassium 3.5 - 5.1 mmol/L 3.6    ?Chloride 101 - 111 mmol/L 107    ?CO2 22 - 32 mmol/L 25    ?Calcium 8.9 - 10.3 mg/dL 8.8    ?Total Protein 6.5 - 8.1 g/dL 7.2    ?Total Bilirubin 0.3 - 1.2 mg/dL 0.4    ?Alkaline Phos 38 - 126 U/L 53    ?AST 15 - 41 U/L 27    ?ALT 14 - 54 U/L 21    ? ? ? ?RADIOGRAPHIC STUDIES: ?I have personally reviewed the radiological images as listed and agreed with the findings in the report. ?CT CHEST LUNG CA SCREEN LOW DOSE W/O CM ? ?Result Date: 11/03/2021 ?CLINICAL DATA:  Current smoker with 42 pack-year history EXAM: CT CHEST WITHOUT CONTRAST LOW-DOSE FOR LUNG CANCER SCREENING TECHNIQUE: Multidetector CT imaging of the chest was performed following the standard protocol  without IV contrast. RADIATION DOSE REDUCTION: This exam was performed according to the departmental dose-optimization program which includes automated exposure control, adjustment of the mA and/or kV according to patient size and/or use of iterative reconstruction technique. COMPARISON:  Lung cancer screening CT dated August 17, 2018 FINDINGS: Cardiovascular: Normal heart size. Trace pericardial fluid. Mild calcified plaque of the thoracic aorta. No coronary artery calcifications. Mediastinum/Nodes: Esophagus and thyroid are unremarkable. No pathologically enlarged lymph nodes seen in the chest.  Lungs/Pleura: Central airways are patent. Mild centrilobular emphysema. No consolidation, pleural effusion or pneumothorax. No suspicious pulmonary nodules. Upper Abdomen: No acute abnormality. Musculoskeletal: No chest wall mass or suspicious bone lesions identified. IMPRESSION: 1. Lung-RADS 1, negative. Continue annual screening with low-dose chest CT without contrast in 12 months. 2. Aortic Atherosclerosis (ICD10-I70.0) and Emphysema (ICD10-J43.9). Electronically Signed   By: Allegra Lai M.D.   On: 11/03/2021 16:47   ? ?ASSESSMENT & PLAN:  ? ?61 year old with ? ?#1 mild Macrocytic anemia. ?Plan  ?-patient CBC today shows improvement in her hemoglobin from 10.9 in March up to 11.7. ?She has been on chronic PPI therapy is and B vitamin deficiencies is certainly a consideration.  Her recent B12 levels are okay and she continues to be on B12 replacement for chronic B12 deficiency. ?She was to-commended to start taking empiric B complex 1 capsule p.o. daily due to other possible B vitamin deficiencies. ?Iron levels are within normal limits ?We will check myeloma panel and kappa lambda free light chains ?Her recent TSH was within normal limits but we will check a free T4 level as well ?Cannot rule out low-grade MDS ?We will check LDH to rule out analysis ?Copper levels ?No indication for blood transfusions or acute  treatment at this time. ?Patient counseled on smoking cessation ? ?Orders Placed This Encounter  ?Procedures  ? CBC with Differential/Platelet  ?  Standing Status:   Future  ?  Number of Occurrences:   1  ?  S

## 2021-11-10 ENCOUNTER — Telehealth: Payer: Self-pay | Admitting: Hematology

## 2021-11-10 LAB — COPPER, SERUM: Copper: 116 ug/dL (ref 80–158)

## 2021-11-10 LAB — KAPPA/LAMBDA LIGHT CHAINS
Kappa free light chain: 23.8 mg/L — ABNORMAL HIGH (ref 3.3–19.4)
Kappa, lambda light chain ratio: 1.41 (ref 0.26–1.65)
Lambda free light chains: 16.9 mg/L (ref 5.7–26.3)

## 2021-11-10 NOTE — Telephone Encounter (Signed)
Scheduled follow-up appointment per 4/17 los. Patient is aware. ?

## 2021-11-11 LAB — MULTIPLE MYELOMA PANEL, SERUM
Albumin SerPl Elph-Mcnc: 4 g/dL (ref 2.9–4.4)
Albumin/Glob SerPl: 1.3 (ref 0.7–1.7)
Alpha 1: 0.2 g/dL (ref 0.0–0.4)
Alpha2 Glob SerPl Elph-Mcnc: 0.7 g/dL (ref 0.4–1.0)
B-Globulin SerPl Elph-Mcnc: 0.9 g/dL (ref 0.7–1.3)
Gamma Glob SerPl Elph-Mcnc: 1.3 g/dL (ref 0.4–1.8)
Globulin, Total: 3.1 g/dL (ref 2.2–3.9)
IgA: 121 mg/dL (ref 87–352)
IgG (Immunoglobin G), Serum: 1231 mg/dL (ref 586–1602)
IgM (Immunoglobulin M), Srm: 192 mg/dL (ref 26–217)
Total Protein ELP: 7.1 g/dL (ref 6.0–8.5)

## 2021-11-24 ENCOUNTER — Inpatient Hospital Stay: Payer: BC Managed Care – PPO | Attending: Hematology | Admitting: Hematology

## 2021-11-24 DIAGNOSIS — D649 Anemia, unspecified: Secondary | ICD-10-CM | POA: Insufficient documentation

## 2021-11-24 NOTE — Progress Notes (Signed)
. ? ? ?HEMATOLOGY/ONCOLOGY PHONE VISIT NOTE ? ?Date of Service: 11/24/2021 ? ?Patient Care Team: ?Rodrigo RanPerini, Mark, MD as PCP - General (Internal Medicine) ? ?CHIEF COMPLAINTS/PURPOSE OF CONSULTATION:  ?Evaluation and management of macrocytic anemia ? ?HISTORY OF PRESENTING ILLNESS: (11/09/2021) ? ?Judith Black is a wonderful 61 y.o. female who has been referred to us by Dr .Rodrigo RanPerini, Mark, MD ?for evaluation and management of anemia. ? ?Patient notes she has a history of irritable bowel syndrome, GERD, COPD/emphysema, osteoporosis and on recent labs with Dr. [name] on 09/29/2021 -was noted to have a hemoglobin of 10.9 with an MCV of 101 and normal RDW, normal WBC count and platelets. ? ?Patient has had a previous history of B12 deficiency and was previously on parenteral vitamin B12 and is currently on B12 Gummies 1000 mcg daily. ?She notes that she has vitamin D deficiency and has been on vitamin D replacement recently. ? ?She notes that due to her irritable bowel syndrome he had decreased p.o. intake and has lost weight and was down from 121---111 lbs but is now back up to 115lbs ?Probiotics have helped with IBS. ?No previous history of thyroid disease ? ?Patient notes no new bone pains.  No fevers no chills no night sweats no unexpected weight loss. ?No recent new medications. ?We will be starting on Prolia for her osteoporosis. ?And has been on chronic PPI therapy for acid reflux. ?Patient notes she smokes a pack a day of cigarettes. ?Denies significant alcohol use or any other drug use. ? ?INTERVAL HISTORY (11/24/2021) ? ?.I connected with Judith NimsKathryn P Harpenau on 11/24/21 at  8:40 AM EDT by telephone visit and verified that I am speaking with the correct person using two identifiers.  ? ?I discussed the limitations, risks, security and privacy concerns of performing an evaluation and management service by telemedicine and the availability of in-person appointments. I also discussed with the patient that there may be  a patient responsible charge related to this service. The patient expressed understanding and agreed to proceed.  ? ?Other persons participating in the visit and their role in the encounter: None ? ?Patient?s location: Home ?Provider?s location: [facility] ? ?Chief Complaint: Follow-up for macrocytic anemia and discussion of work-up results. ? ?Ms Conception OmsBlakemore was called to discuss her lab results for work-up of her macrocytic anemia. ?She notes some continued mild fatigue.  No other significant constitutional symptoms.  No fever no chills no night sweats. ?She continues to have some pain in her right buttocks. ?She notes that she will be starting Prolia with Dr. [name] soon. ?Her lab results from 11/09/2021 were discussed in detail. ? ?MEDICAL HISTORY:  ?Past Medical History:  ?Diagnosis Date  ? GERD (gastroesophageal reflux disease)   ? IBS (irritable bowel syndrome)   ? Panic disorder   ?-Osteoporosis ?-COPD/Emphysema ?-menopause 44 yrs-- kids at 61 yo . 61 y/o ? ?SURGICAL HISTORY: ?Past Surgical History:  ?Procedure Laterality Date  ? DILATION AND CURETTAGE, DIAGNOSTIC / THERAPEUTIC    ? ? ?SOCIAL HISTORY: ?Social History  ? ?Socioeconomic History  ? Marital status: Single  ?  Spouse name: Not on file  ? Number of children: Not on file  ? Years of education: Not on file  ? Highest education level: Not on file  ?Occupational History  ? Not on file  ?Tobacco Use  ? Smoking status: Every Day  ?  Packs/day: 1.00  ?  Types: Cigarettes  ? Smokeless tobacco: Never  ? Tobacco comments:  ?  down to 1/2ppd since 06/06/19  ?Substance and Sexual Activity  ? Alcohol use: No  ? Drug use: No  ? Sexual activity: Not on file  ?Other Topics Concern  ? Not on file  ?Social History Narrative  ? Divorced, 2 children   ? ?Social Determinants of Health  ? ?Financial Resource Strain: Not on file  ?Food Insecurity: Not on file  ?Transportation Needs: Not on file  ?Physical Activity: Not on file  ?Stress: Not on file  ?Social  Connections: Not on file  ?Intimate Partner Violence: Not on file  ? ? ?FAMILY HISTORY: ?Family History  ?Problem Relation Age of Onset  ? Scoliosis Mother   ? Prostate cancer Father   ? ? ?ALLERGIES:  has No Known Allergies. ? ?MEDICATIONS:  ?Current Outpatient Medications  ?Medication Sig Dispense Refill  ? albuterol (VENTOLIN HFA) 108 (90 Base) MCG/ACT inhaler Inhale 1-2 puffs into the lungs every 6 (six) hours as needed for wheezing or shortness of breath. (Patient not taking: Reported on 09/20/2019) 18 g 6  ? dicyclomine (BENTYL) 10 MG capsule TAKE 1 CAPSULE BY MOUTH UP TO 3 TIMES A DAY AS NEEDED FOR ABDOMINAL PAIN    ? estradiol (VIVELLE-DOT) 0.05 MG/24HR patch Place onto the skin.    ? naproxen (NAPROSYN) 500 MG tablet Take 1 tablet (500 mg total) by mouth 2 (two) times daily. (Patient not taking: Reported on 11/09/2021) 30 tablet 0  ? pantoprazole (PROTONIX) 40 MG tablet Take 40 mg by mouth daily. (Patient not taking: Reported on 11/09/2021)    ? PARoxetine (PAXIL-CR) 25 MG 24 hr tablet Take 25 mg by mouth daily.    ? progesterone (PROMETRIUM) 100 MG capsule Take 100 mg by mouth daily.    ? umeclidinium-vilanterol (ANORO ELLIPTA) 62.5-25 MCG/INH AEPB Inhale 1 puff into the lungs daily. 60 each 2  ? ?No current facility-administered medications for this visit.  ? ? ?REVIEW OF SYSTEMS:   ?10 Point review of Systems was done is negative except as noted above. ? ?PHYSICAL EXAMINATION: ?Telemedicine visit ? ?LABORATORY DATA:  ?I have reviewed the data as listed ? ?. ? ?  Latest Ref Rng & Units 11/09/2021  ? 12:35 PM 02/18/2016  ?  3:30 AM  ?CBC  ?WBC 4.0 - 10.5 K/uL 5.8   8.1    ?Hemoglobin 12.0 - 15.0 g/dL 90.3   00.9    ?Hematocrit 36.0 - 46.0 % 34.2   34.7    ?Platelets 150 - 400 K/uL 293   282    ? ?.CBC ?   ?Component Value Date/Time  ? WBC 5.8 11/09/2021 1235  ? RBC 3.36 (L) 11/09/2021 1235  ? HGB 11.5 (L) 11/09/2021 1235  ? HCT 34.2 (L) 11/09/2021 1235  ? PLT 293 11/09/2021 1235  ? MCV 101.8 (H) 11/09/2021 1235   ? MCH 34.2 (H) 11/09/2021 1235  ? MCHC 33.6 11/09/2021 1235  ? RDW 12.8 11/09/2021 1235  ? LYMPHSABS 2.5 11/09/2021 1235  ? MONOABS 0.4 11/09/2021 1235  ? EOSABS 0.4 11/09/2021 1235  ? BASOSABS 0.2 (H) 11/09/2021 1235  ? ? ? ? ?  Latest Ref Rng & Units 11/09/2021  ? 12:35 PM 02/18/2016  ?  3:30 AM  ?CMP  ?Glucose 70 - 99 mg/dL 98   233    ?BUN 6 - 20 mg/dL 14   15    ?Creatinine 0.44 - 1.00 mg/dL 0.07   6.22    ?Sodium 135 - 145 mmol/L 139   140    ?  Potassium 3.5 - 5.1 mmol/L 3.9   3.6    ?Chloride 98 - 111 mmol/L 105   107    ?CO2 22 - 32 mmol/L 28   25    ?Calcium 8.9 - 10.3 mg/dL 9.2   8.8    ?Total Protein 6.5 - 8.1 g/dL 7.5   7.2    ?Total Bilirubin 0.3 - 1.2 mg/dL 0.2   0.4    ?Alkaline Phos 38 - 126 U/L 60   53    ?AST 15 - 41 U/L 21   27    ?ALT 0 - 44 U/L 19   21    ? ?. ?Lab Results  ?Component Value Date  ? LDH 137 11/09/2021  ? ?. ?Lab Results  ?Component Value Date  ? IRON 91 11/09/2021  ? TIBC 342 11/09/2021  ? IRONPCTSAT 27 11/09/2021  ? ?(Iron and TIBC) ? ?Lab Results  ?Component Value Date  ? FERRITIN 23 11/09/2021  ? ?Vitamin B12 levels 2299. ? ?25-hydroxy vitamin D --28 ? ?Free T4----0.63 ? ?Copper levels-116 ? ?Sed rate 18 ? ? ? ?RADIOGRAPHIC STUDIES: ?I have personally reviewed the radiological images as listed and agreed with the findings in the report. ?CT CHEST LUNG CA SCREEN LOW DOSE W/O CM ? ?Result Date: 11/03/2021 ?CLINICAL DATA:  Current smoker with 42 pack-year history EXAM: CT CHEST WITHOUT CONTRAST LOW-DOSE FOR LUNG CANCER SCREENING TECHNIQUE: Multidetector CT imaging of the chest was performed following the standard protocol without IV contrast. RADIATION DOSE REDUCTION: This exam was performed according to the departmental dose-optimization program which includes automated exposure control, adjustment of the mA and/or kV according to patient size and/or use of iterative reconstruction technique. COMPARISON:  Lung cancer screening CT dated August 17, 2018 FINDINGS: Cardiovascular:  Normal heart size. Trace pericardial fluid. Mild calcified plaque of the thoracic aorta. No coronary artery calcifications. Mediastinum/Nodes: Esophagus and thyroid are unremarkable. No pathologically e

## 2021-11-26 ENCOUNTER — Encounter (HOSPITAL_COMMUNITY): Payer: BC Managed Care – PPO

## 2021-11-30 ENCOUNTER — Telehealth: Payer: Self-pay | Admitting: Hematology

## 2021-11-30 NOTE — Telephone Encounter (Signed)
Scheduled follow-up appointments per 5/2 los. Patient is aware. ?

## 2022-02-01 ENCOUNTER — Other Ambulatory Visit: Payer: Self-pay | Admitting: Internal Medicine

## 2022-02-01 DIAGNOSIS — M542 Cervicalgia: Secondary | ICD-10-CM

## 2022-02-12 ENCOUNTER — Other Ambulatory Visit: Payer: BC Managed Care – PPO

## 2022-02-17 DIAGNOSIS — E538 Deficiency of other specified B group vitamins: Secondary | ICD-10-CM | POA: Diagnosis not present

## 2022-02-18 DIAGNOSIS — M810 Age-related osteoporosis without current pathological fracture: Secondary | ICD-10-CM | POA: Diagnosis not present

## 2022-02-23 ENCOUNTER — Other Ambulatory Visit: Payer: Self-pay | Admitting: *Deleted

## 2022-02-23 ENCOUNTER — Other Ambulatory Visit: Payer: Self-pay

## 2022-02-23 ENCOUNTER — Inpatient Hospital Stay: Payer: BC Managed Care – PPO | Attending: Hematology

## 2022-02-23 ENCOUNTER — Other Ambulatory Visit: Payer: Self-pay | Admitting: Hematology

## 2022-02-23 DIAGNOSIS — K219 Gastro-esophageal reflux disease without esophagitis: Secondary | ICD-10-CM | POA: Diagnosis not present

## 2022-02-23 DIAGNOSIS — D509 Iron deficiency anemia, unspecified: Secondary | ICD-10-CM | POA: Diagnosis not present

## 2022-02-23 DIAGNOSIS — D649 Anemia, unspecified: Secondary | ICD-10-CM

## 2022-02-23 DIAGNOSIS — E538 Deficiency of other specified B group vitamins: Secondary | ICD-10-CM | POA: Diagnosis not present

## 2022-02-23 DIAGNOSIS — R634 Abnormal weight loss: Secondary | ICD-10-CM | POA: Diagnosis not present

## 2022-02-23 DIAGNOSIS — E559 Vitamin D deficiency, unspecified: Secondary | ICD-10-CM | POA: Insufficient documentation

## 2022-02-23 DIAGNOSIS — K589 Irritable bowel syndrome without diarrhea: Secondary | ICD-10-CM | POA: Insufficient documentation

## 2022-02-23 LAB — RETICULOCYTES
Immature Retic Fract: 11.1 % (ref 2.3–15.9)
RBC.: 3.4 MIL/uL — ABNORMAL LOW (ref 3.87–5.11)
Retic Count, Absolute: 34 10*3/uL (ref 19.0–186.0)
Retic Ct Pct: 1 % (ref 0.4–3.1)

## 2022-02-23 LAB — CBC WITH DIFFERENTIAL (CANCER CENTER ONLY)
Abs Immature Granulocytes: 0.1 10*3/uL — ABNORMAL HIGH (ref 0.00–0.07)
Basophils Absolute: 0.2 10*3/uL — ABNORMAL HIGH (ref 0.0–0.1)
Basophils Relative: 4 %
Eosinophils Absolute: 0.4 10*3/uL (ref 0.0–0.5)
Eosinophils Relative: 7 %
HCT: 34.6 % — ABNORMAL LOW (ref 36.0–46.0)
Hemoglobin: 11.8 g/dL — ABNORMAL LOW (ref 12.0–15.0)
Immature Granulocytes: 2 %
Lymphocytes Relative: 36 %
Lymphs Abs: 2.1 10*3/uL (ref 0.7–4.0)
MCH: 34.3 pg — ABNORMAL HIGH (ref 26.0–34.0)
MCHC: 34.1 g/dL (ref 30.0–36.0)
MCV: 100.6 fL — ABNORMAL HIGH (ref 80.0–100.0)
Monocytes Absolute: 0.4 10*3/uL (ref 0.1–1.0)
Monocytes Relative: 6 %
Neutro Abs: 2.6 10*3/uL (ref 1.7–7.7)
Neutrophils Relative %: 45 %
Platelet Count: 266 10*3/uL (ref 150–400)
RBC: 3.44 MIL/uL — ABNORMAL LOW (ref 3.87–5.11)
RDW: 12.7 % (ref 11.5–15.5)
WBC Count: 5.7 10*3/uL (ref 4.0–10.5)
nRBC: 0 % (ref 0.0–0.2)

## 2022-02-23 LAB — CMP (CANCER CENTER ONLY)
ALT: 18 U/L (ref 0–44)
AST: 21 U/L (ref 15–41)
Albumin: 4.2 g/dL (ref 3.5–5.0)
Alkaline Phosphatase: 57 U/L (ref 38–126)
Anion gap: 5 (ref 5–15)
BUN: 12 mg/dL (ref 6–20)
CO2: 28 mmol/L (ref 22–32)
Calcium: 8.8 mg/dL — ABNORMAL LOW (ref 8.9–10.3)
Chloride: 108 mmol/L (ref 98–111)
Creatinine: 0.77 mg/dL (ref 0.44–1.00)
GFR, Estimated: >60 mL/min (ref >60)
Glucose, Bld: 88 mg/dL (ref 70–99)
Potassium: 3.8 mmol/L (ref 3.5–5.1)
Sodium: 141 mmol/L (ref 135–145)
Total Bilirubin: 0.3 mg/dL (ref 0.3–1.2)
Total Protein: 7.3 g/dL (ref 6.5–8.1)

## 2022-02-23 LAB — IRON AND IRON BINDING CAPACITY (CC-WL,HP ONLY)
Iron: 110 ug/dL (ref 28–170)
Saturation Ratios: 37 % — ABNORMAL HIGH (ref 10.4–31.8)
TIBC: 298 ug/dL (ref 250–450)
UIBC: 188 ug/dL (ref 148–442)

## 2022-02-23 LAB — LACTATE DEHYDROGENASE: LDH: 137 U/L (ref 98–192)

## 2022-02-23 LAB — VITAMIN B12: Vitamin B-12: 1263 pg/mL — ABNORMAL HIGH (ref 180–914)

## 2022-02-23 LAB — FERRITIN: Ferritin: 17 ng/mL (ref 11–307)

## 2022-02-24 ENCOUNTER — Inpatient Hospital Stay (HOSPITAL_BASED_OUTPATIENT_CLINIC_OR_DEPARTMENT_OTHER): Payer: BC Managed Care – PPO | Admitting: Hematology

## 2022-02-24 DIAGNOSIS — D649 Anemia, unspecified: Secondary | ICD-10-CM

## 2022-03-03 DIAGNOSIS — M810 Age-related osteoporosis without current pathological fracture: Secondary | ICD-10-CM | POA: Diagnosis not present

## 2022-03-03 NOTE — Progress Notes (Addendum)
[facility]   HEMATOLOGY/ONCOLOGY PHONE VISIT NOTE  Date of Service: 02/24/2022   Patient Care Team: Crist Infante, MD as PCP - General (Internal Medicine)  CHIEF COMPLAINTS/PURPOSE OF CONSULTATION:  Continue valuation and management of microcytic anemia  HISTORY OF PRESENTING ILLNESS: (11/09/2021)  Judith Black is a wonderful 61 y.o. female who has been referred to [place] by Dr .Crist Infante, MD for evaluation and management of anemia.  Patient notes she has a history of irritable bowel syndrome, GERD, COPD/emphysema, osteoporosis and on recent labs with Dr. Joylene Draft on 09/29/2021 -was noted to have a hemoglobin of 10.9 with an MCV of 101 and normal RDW, normal WBC count and platelets.  Patient has had a previous history of B12 deficiency and was previously on parenteral vitamin B12 and is currently on B12 Gummies 1000 mcg daily. She notes that she has vitamin D deficiency and has been on vitamin D replacement recently.  She notes that due to her irritable bowel syndrome he had decreased p.o. intake and has lost weight and was down from 121---111 lbs but is now back up to 115lbs Probiotics have helped with IBS. No previous history of thyroid disease  Patient notes no new bone pains.  No fevers no chills no night sweats no unexpected weight loss. No recent new medications. We will be starting on Prolia for her osteoporosis. And has been on chronic PPI therapy for acid reflux. Patient notes she smokes a pack a day of cigarettes. Denies significant alcohol use or any other drug use.  INTERVAL HISTORY (11/24/2021)  .I connected with Judith Black on .02/24/2022 at  8:40 AM EDT by telephone visit and verified that I am speaking with the correct person using two identifiers.   I discussed the limitations, risks, security and privacy concerns of performing an evaluation and management service by telemedicine and the availability of in-person appointments. I also discussed with the patient  that there may be a patient responsible charge related to this service. The patient expressed understanding and agreed to proceed.   Other persons participating in the visit and their role in the encounter: None  Patient's location: Home Provider's location: [place]  Chief Complaint: Follow-up with evaluation and management of macrocytic anemia.  Patient notes no acute new symptoms since her last clinic visit.  No concerns with bleeding issues. Labs done on 02/23/2022 were discussed in detail with the patient  MEDICAL HISTORY:  Past Medical History:  Diagnosis Date   GERD (gastroesophageal reflux disease)    IBS (irritable bowel syndrome)    Panic disorder   -Osteoporosis -COPD/Emphysema -menopause 44 yrs-- kids at 74 yo . 61 y/o  SURGICAL HISTORY: Past Surgical History:  Procedure Laterality Date   DILATION AND CURETTAGE, DIAGNOSTIC / THERAPEUTIC      SOCIAL HISTORY: Social History   Socioeconomic History   Marital status: Single    Spouse name: Not on file   Number of children: Not on file   Years of education: Not on file   Highest education level: Not on file  Occupational History   Not on file  Tobacco Use   Smoking status: Every Day    Packs/day: 1.00    Types: Cigarettes   Smokeless tobacco: Never   Tobacco comments:    down to 1/2ppd since 06/06/19  Substance and Sexual Activity   Alcohol use: No   Drug use: No   Sexual activity: Not on file  Other Topics Concern   Not on file  Social History Narrative   Divorced, 2 children    Social Determinants of Radio broadcast assistant Strain: Not on file  Food Insecurity: Not on file  Transportation Needs: Not on file  Physical Activity: Not on file  Stress: Not on file  Social Connections: Not on file  Intimate Partner Violence: Not on file    FAMILY HISTORY: Family History  Problem Relation Age of Onset   Scoliosis Mother    Prostate cancer Father     ALLERGIES:  is allergic  to codeine.  MEDICATIONS:  Current Outpatient Medications  Medication Sig Dispense Refill   albuterol (VENTOLIN HFA) 108 (90 Base) MCG/ACT inhaler Inhale 1-2 puffs into the lungs every 6 (six) hours as needed for wheezing or shortness of breath. (Patient not taking: Reported on 09/20/2019) 18 g 6   dicyclomine (BENTYL) 10 MG capsule TAKE 1 CAPSULE BY MOUTH UP TO 3 TIMES A DAY AS NEEDED FOR ABDOMINAL PAIN     estradiol (VIVELLE-DOT) 0.05 MG/24HR patch Place onto the skin.     naproxen (NAPROSYN) 500 MG tablet Take 1 tablet (500 mg total) by mouth 2 (two) times daily. (Patient not taking: Reported on 11/09/2021) 30 tablet 0   pantoprazole (PROTONIX) 40 MG tablet Take 40 mg by mouth daily. (Patient not taking: Reported on 11/09/2021)     PARoxetine (PAXIL-CR) 25 MG 24 hr tablet Take 25 mg by mouth daily.     progesterone (PROMETRIUM) 100 MG capsule Take 100 mg by mouth daily.     umeclidinium-vilanterol (ANORO ELLIPTA) 62.5-25 MCG/INH AEPB Inhale 1 puff into the lungs daily. 60 each 2   No current facility-administered medications for this visit.    REVIEW OF SYSTEMS:   10 Point review of Systems was done is negative except as noted above.  PHYSICAL EXAMINATION: Telemedicine visit  LABORATORY DATA:  I have reviewed the data as listed  .    Latest Ref Rng & Units 02/23/2022    9:06 AM 11/09/2021   12:35 PM 02/18/2016    3:30 AM  CBC  WBC 4.0 - 10.5 K/uL 5.7  5.8  8.1   Hemoglobin 12.0 - 15.0 g/dL 11.8  11.5  11.7   Hematocrit 36.0 - 46.0 % 34.6  34.2  34.7   Platelets 150 - 400 K/uL 266  293  282    .CBC    Component Value Date/Time   WBC 5.7 02/23/2022 0906   WBC 5.8 11/09/2021 1235   RBC 3.44 (L) 02/23/2022 0906   RBC 3.40 (L) 02/23/2022 0906   HGB 11.8 (L) 02/23/2022 0906   HCT 34.6 (L) 02/23/2022 0906   PLT 266 02/23/2022 0906   MCV 100.6 (H) 02/23/2022 0906   MCH 34.3 (H) 02/23/2022 0906   MCHC 34.1 02/23/2022 0906   RDW 12.7 02/23/2022 0906   LYMPHSABS 2.1 02/23/2022  0906   MONOABS 0.4 02/23/2022 0906   EOSABS 0.4 02/23/2022 0906   BASOSABS 0.2 (H) 02/23/2022 0906        Latest Ref Rng & Units 02/23/2022    9:06 AM 11/09/2021   12:35 PM 02/18/2016    3:30 AM  CMP  Glucose 70 - 99 mg/dL 88  98  102   BUN 6 - 20 mg/dL _0 Creatinine 0.44 - 1.00 mg/dL 0.77  0.74  0.75   Sodium 135 - 145 mmol/L 141  139  140   Potassium 3.5 - 5.1 mmol/L 3.8  3.9  3.6  Chloride 98 - 111 mmol/L 108  105  107   CO2 22 - 32 mmol/L _0 Calcium 8.9 - 10.3 mg/dL 8.8  9.2  8.8   Total Protein 6.5 - 8.1 g/dL 7.3  7.5  7.2   Total Bilirubin 0.3 - 1.2 mg/dL 0.3  0.2  0.4   Alkaline Phos 38 - 126 U/L 57  60  53   AST 15 - 41 U/L _1 ALT 0 - 44 U/L _2 . Lab Results  Component Value Date   LDH 137 02/23/2022   . Lab Results  Component Value Date   IRON 110 02/23/2022   TIBC 298 02/23/2022   IRONPCTSAT 37 (H) 02/23/2022   (Iron and TIBC)  Lab Results  Component Value Date   FERRITIN 17 02/23/2022   Vitamin B12 levels 2299.  25-hydroxy vitamin D --28  Free T4----0.63  Copper levels-116  Sed rate 18    RADIOGRAPHIC STUDIES: I have personally reviewed the radiological images as listed and agreed with the findings in the report. No results found.  ASSESSMENT & PLAN:   61 year old with  #1  Macrocytic anemia -mild. Patient's hemoglobin has improved from 10.9 in March up to 11.5 most recent labs. Plan  Patient's labs done on 02/23/2022 were discussed in details with the patient. CBC showed improvement of hemoglobin to 11.8 with normal WBC count of 5.7k and platelets of 266k CMP within normal limits B12 adequate Reticulocyte count at 1% Ferritin 17 with an iron saturation of 37% LDH within normal limits at 137 Given improving and nearly resolved anemia will hold off on bone marrow biopsy at this time. Continue iron polysaccharide 150 mg p.o. daily to optimize ferritin levels to more than 50. Continue follow-up  with PCP if patient develops worsening anemia or other cytopenias please consider referring the patient back to [place] for consideration of bone marrow biopsy for further evaluation. -Patient will continue to follow with PCP for optimization of thyroid replacement  Follow-up Return to clinic with Dr. Irene Limbo as needed the total time spent in the appointment was 15 minutes*.  All of the patient's questions were answered with apparent satisfaction. The patient knows to call the clinic with any problems, questions or concerns.   Sullivan Lone MD MS AAHIVMS [facility] Hematology/Oncology Physician [facility]  .*Total Encounter Time as defined by the Centers for Medicare and Medicaid Services includes, in addition to the face-to-face time of a patient visit (documented in the note above) non-face-to-face time: obtaining and reviewing outside history, ordering and reviewing medications, tests or procedures, care coordination (communications with other health care professionals or caregivers) and documentation in the medical record.

## 2022-03-22 DIAGNOSIS — F331 Major depressive disorder, recurrent, moderate: Secondary | ICD-10-CM | POA: Diagnosis not present

## 2022-04-28 DIAGNOSIS — F331 Major depressive disorder, recurrent, moderate: Secondary | ICD-10-CM | POA: Diagnosis not present

## 2022-05-14 DIAGNOSIS — R0981 Nasal congestion: Secondary | ICD-10-CM | POA: Diagnosis not present

## 2022-05-14 DIAGNOSIS — R634 Abnormal weight loss: Secondary | ICD-10-CM | POA: Diagnosis not present

## 2022-05-14 DIAGNOSIS — D649 Anemia, unspecified: Secondary | ICD-10-CM | POA: Diagnosis not present

## 2022-05-18 DIAGNOSIS — N951 Menopausal and female climacteric states: Secondary | ICD-10-CM | POA: Diagnosis not present

## 2022-06-16 DIAGNOSIS — F331 Major depressive disorder, recurrent, moderate: Secondary | ICD-10-CM | POA: Diagnosis not present

## 2022-08-24 DIAGNOSIS — M810 Age-related osteoporosis without current pathological fracture: Secondary | ICD-10-CM | POA: Diagnosis not present

## 2022-09-01 DIAGNOSIS — F331 Major depressive disorder, recurrent, moderate: Secondary | ICD-10-CM | POA: Diagnosis not present

## 2022-09-10 DIAGNOSIS — F321 Major depressive disorder, single episode, moderate: Secondary | ICD-10-CM | POA: Diagnosis not present

## 2022-09-21 DIAGNOSIS — M810 Age-related osteoporosis without current pathological fracture: Secondary | ICD-10-CM | POA: Diagnosis not present

## 2022-09-27 DIAGNOSIS — D320 Benign neoplasm of cerebral meninges: Secondary | ICD-10-CM | POA: Diagnosis not present

## 2022-09-27 DIAGNOSIS — G935 Compression of brain: Secondary | ICD-10-CM | POA: Diagnosis not present

## 2022-09-27 DIAGNOSIS — G939 Disorder of brain, unspecified: Secondary | ICD-10-CM | POA: Diagnosis not present

## 2022-10-27 DIAGNOSIS — I700 Atherosclerosis of aorta: Secondary | ICD-10-CM | POA: Diagnosis not present

## 2022-10-27 DIAGNOSIS — M810 Age-related osteoporosis without current pathological fracture: Secondary | ICD-10-CM | POA: Diagnosis not present

## 2022-10-27 DIAGNOSIS — E538 Deficiency of other specified B group vitamins: Secondary | ICD-10-CM | POA: Diagnosis not present

## 2022-10-27 DIAGNOSIS — K219 Gastro-esophageal reflux disease without esophagitis: Secondary | ICD-10-CM | POA: Diagnosis not present

## 2022-10-27 DIAGNOSIS — E785 Hyperlipidemia, unspecified: Secondary | ICD-10-CM | POA: Diagnosis not present

## 2022-11-02 DIAGNOSIS — Z0000 Encounter for general adult medical examination without abnormal findings: Secondary | ICD-10-CM | POA: Diagnosis not present

## 2022-11-02 DIAGNOSIS — Z1331 Encounter for screening for depression: Secondary | ICD-10-CM | POA: Diagnosis not present

## 2022-11-02 DIAGNOSIS — M25511 Pain in right shoulder: Secondary | ICD-10-CM | POA: Diagnosis not present

## 2022-11-02 DIAGNOSIS — Z1339 Encounter for screening examination for other mental health and behavioral disorders: Secondary | ICD-10-CM | POA: Diagnosis not present

## 2022-11-02 DIAGNOSIS — R82998 Other abnormal findings in urine: Secondary | ICD-10-CM | POA: Diagnosis not present

## 2022-11-02 DIAGNOSIS — Z23 Encounter for immunization: Secondary | ICD-10-CM | POA: Diagnosis not present

## 2022-11-03 ENCOUNTER — Other Ambulatory Visit: Payer: Self-pay | Admitting: Internal Medicine

## 2022-11-03 DIAGNOSIS — F17200 Nicotine dependence, unspecified, uncomplicated: Secondary | ICD-10-CM

## 2022-11-10 DIAGNOSIS — F411 Generalized anxiety disorder: Secondary | ICD-10-CM | POA: Diagnosis not present

## 2022-11-24 DIAGNOSIS — Z1231 Encounter for screening mammogram for malignant neoplasm of breast: Secondary | ICD-10-CM | POA: Diagnosis not present

## 2022-11-25 DIAGNOSIS — M25512 Pain in left shoulder: Secondary | ICD-10-CM | POA: Diagnosis not present

## 2022-11-25 DIAGNOSIS — M5030 Other cervical disc degeneration, unspecified cervical region: Secondary | ICD-10-CM | POA: Diagnosis not present

## 2022-11-25 DIAGNOSIS — M25511 Pain in right shoulder: Secondary | ICD-10-CM | POA: Diagnosis not present

## 2022-11-29 ENCOUNTER — Other Ambulatory Visit: Payer: Self-pay | Admitting: Obstetrics and Gynecology

## 2022-11-29 DIAGNOSIS — R928 Other abnormal and inconclusive findings on diagnostic imaging of breast: Secondary | ICD-10-CM

## 2022-12-01 DIAGNOSIS — Z6820 Body mass index (BMI) 20.0-20.9, adult: Secondary | ICD-10-CM | POA: Diagnosis not present

## 2022-12-01 DIAGNOSIS — Z124 Encounter for screening for malignant neoplasm of cervix: Secondary | ICD-10-CM | POA: Diagnosis not present

## 2022-12-01 DIAGNOSIS — R319 Hematuria, unspecified: Secondary | ICD-10-CM | POA: Diagnosis not present

## 2022-12-01 DIAGNOSIS — Z1151 Encounter for screening for human papillomavirus (HPV): Secondary | ICD-10-CM | POA: Diagnosis not present

## 2022-12-01 DIAGNOSIS — Z01419 Encounter for gynecological examination (general) (routine) without abnormal findings: Secondary | ICD-10-CM | POA: Diagnosis not present

## 2022-12-06 ENCOUNTER — Ambulatory Visit
Admission: RE | Admit: 2022-12-06 | Discharge: 2022-12-06 | Disposition: A | Discharge status: Home / Self Care | Payer: BC Managed Care – PPO | Source: Ambulatory Visit | Attending: Internal Medicine | Admitting: Internal Medicine

## 2022-12-06 DIAGNOSIS — F17200 Nicotine dependence, unspecified, uncomplicated: Secondary | ICD-10-CM

## 2022-12-06 DIAGNOSIS — I700 Atherosclerosis of aorta: Secondary | ICD-10-CM | POA: Diagnosis not present

## 2022-12-06 DIAGNOSIS — Z122 Encounter for screening for malignant neoplasm of respiratory organs: Secondary | ICD-10-CM | POA: Diagnosis not present

## 2022-12-06 DIAGNOSIS — J439 Emphysema, unspecified: Secondary | ICD-10-CM | POA: Diagnosis not present

## 2022-12-07 ENCOUNTER — Ambulatory Visit
Admission: RE | Admit: 2022-12-07 | Discharge: 2022-12-07 | Disposition: A | Discharge status: Home / Self Care | Payer: BC Managed Care – PPO | Source: Ambulatory Visit | Attending: Obstetrics and Gynecology | Admitting: Obstetrics and Gynecology

## 2022-12-07 DIAGNOSIS — R928 Other abnormal and inconclusive findings on diagnostic imaging of breast: Secondary | ICD-10-CM

## 2022-12-07 DIAGNOSIS — N6002 Solitary cyst of left breast: Secondary | ICD-10-CM | POA: Diagnosis not present

## 2022-12-15 DIAGNOSIS — F411 Generalized anxiety disorder: Secondary | ICD-10-CM | POA: Diagnosis not present

## 2022-12-16 DIAGNOSIS — M25511 Pain in right shoulder: Secondary | ICD-10-CM | POA: Diagnosis not present

## 2022-12-16 DIAGNOSIS — M5010 Cervical disc disorder with radiculopathy, unspecified cervical region: Secondary | ICD-10-CM | POA: Diagnosis not present

## 2022-12-27 DIAGNOSIS — M5010 Cervical disc disorder with radiculopathy, unspecified cervical region: Secondary | ICD-10-CM | POA: Diagnosis not present

## 2022-12-27 DIAGNOSIS — M25511 Pain in right shoulder: Secondary | ICD-10-CM | POA: Diagnosis not present

## 2023-01-21 DIAGNOSIS — M5010 Cervical disc disorder with radiculopathy, unspecified cervical region: Secondary | ICD-10-CM | POA: Diagnosis not present

## 2023-01-21 DIAGNOSIS — M25511 Pain in right shoulder: Secondary | ICD-10-CM | POA: Diagnosis not present

## 2023-01-24 DIAGNOSIS — L570 Actinic keratosis: Secondary | ICD-10-CM | POA: Diagnosis not present

## 2023-01-28 DIAGNOSIS — M5010 Cervical disc disorder with radiculopathy, unspecified cervical region: Secondary | ICD-10-CM | POA: Diagnosis not present

## 2023-01-28 DIAGNOSIS — M25511 Pain in right shoulder: Secondary | ICD-10-CM | POA: Diagnosis not present

## 2023-02-02 DIAGNOSIS — M5010 Cervical disc disorder with radiculopathy, unspecified cervical region: Secondary | ICD-10-CM | POA: Diagnosis not present

## 2023-02-02 DIAGNOSIS — M25511 Pain in right shoulder: Secondary | ICD-10-CM | POA: Diagnosis not present

## 2023-02-09 DIAGNOSIS — F4001 Agoraphobia with panic disorder: Secondary | ICD-10-CM | POA: Diagnosis not present

## 2023-02-21 DIAGNOSIS — M810 Age-related osteoporosis without current pathological fracture: Secondary | ICD-10-CM | POA: Diagnosis not present

## 2023-03-17 DIAGNOSIS — L309 Dermatitis, unspecified: Secondary | ICD-10-CM | POA: Diagnosis not present

## 2023-04-04 DIAGNOSIS — M810 Age-related osteoporosis without current pathological fracture: Secondary | ICD-10-CM | POA: Diagnosis not present

## 2023-04-04 DIAGNOSIS — A630 Anogenital (venereal) warts: Secondary | ICD-10-CM | POA: Diagnosis not present

## 2023-04-04 DIAGNOSIS — N959 Unspecified menopausal and perimenopausal disorder: Secondary | ICD-10-CM | POA: Diagnosis not present

## 2023-04-05 DIAGNOSIS — E559 Vitamin D deficiency, unspecified: Secondary | ICD-10-CM | POA: Diagnosis not present

## 2023-04-05 DIAGNOSIS — M810 Age-related osteoporosis without current pathological fracture: Secondary | ICD-10-CM | POA: Diagnosis not present

## 2023-05-12 DIAGNOSIS — D485 Neoplasm of uncertain behavior of skin: Secondary | ICD-10-CM | POA: Diagnosis not present

## 2023-05-12 DIAGNOSIS — D0461 Carcinoma in situ of skin of right upper limb, including shoulder: Secondary | ICD-10-CM | POA: Diagnosis not present

## 2023-05-12 DIAGNOSIS — L570 Actinic keratosis: Secondary | ICD-10-CM | POA: Diagnosis not present

## 2023-05-24 DIAGNOSIS — D0461 Carcinoma in situ of skin of right upper limb, including shoulder: Secondary | ICD-10-CM | POA: Diagnosis not present

## 2023-05-24 DIAGNOSIS — L089 Local infection of the skin and subcutaneous tissue, unspecified: Secondary | ICD-10-CM | POA: Diagnosis not present

## 2023-08-12 DIAGNOSIS — R319 Hematuria, unspecified: Secondary | ICD-10-CM | POA: Diagnosis not present

## 2023-08-12 DIAGNOSIS — N819 Female genital prolapse, unspecified: Secondary | ICD-10-CM | POA: Diagnosis not present

## 2023-08-12 DIAGNOSIS — N3946 Mixed incontinence: Secondary | ICD-10-CM | POA: Diagnosis not present

## 2023-08-17 DIAGNOSIS — L905 Scar conditions and fibrosis of skin: Secondary | ICD-10-CM | POA: Diagnosis not present

## 2023-09-19 DIAGNOSIS — D1801 Hemangioma of skin and subcutaneous tissue: Secondary | ICD-10-CM | POA: Diagnosis not present

## 2023-09-19 DIAGNOSIS — L570 Actinic keratosis: Secondary | ICD-10-CM | POA: Diagnosis not present

## 2023-09-19 DIAGNOSIS — L821 Other seborrheic keratosis: Secondary | ICD-10-CM | POA: Diagnosis not present

## 2023-09-19 DIAGNOSIS — L814 Other melanin hyperpigmentation: Secondary | ICD-10-CM | POA: Diagnosis not present

## 2023-09-19 DIAGNOSIS — L578 Other skin changes due to chronic exposure to nonionizing radiation: Secondary | ICD-10-CM | POA: Diagnosis not present

## 2023-09-28 DIAGNOSIS — F411 Generalized anxiety disorder: Secondary | ICD-10-CM | POA: Diagnosis not present

## 2023-10-20 DIAGNOSIS — H0279 Other degenerative disorders of eyelid and periocular area: Secondary | ICD-10-CM | POA: Diagnosis not present

## 2023-10-20 DIAGNOSIS — H02831 Dermatochalasis of right upper eyelid: Secondary | ICD-10-CM | POA: Diagnosis not present

## 2023-10-20 DIAGNOSIS — H02834 Dermatochalasis of left upper eyelid: Secondary | ICD-10-CM | POA: Diagnosis not present

## 2023-11-14 DIAGNOSIS — H02834 Dermatochalasis of left upper eyelid: Secondary | ICD-10-CM | POA: Diagnosis not present

## 2023-11-14 DIAGNOSIS — H02831 Dermatochalasis of right upper eyelid: Secondary | ICD-10-CM | POA: Diagnosis not present

## 2023-12-07 DIAGNOSIS — M545 Low back pain, unspecified: Secondary | ICD-10-CM | POA: Diagnosis not present

## 2023-12-07 DIAGNOSIS — M810 Age-related osteoporosis without current pathological fracture: Secondary | ICD-10-CM | POA: Diagnosis not present

## 2023-12-09 ENCOUNTER — Other Ambulatory Visit: Payer: Self-pay | Admitting: Family Medicine

## 2023-12-09 DIAGNOSIS — I6522 Occlusion and stenosis of left carotid artery: Secondary | ICD-10-CM

## 2023-12-09 DIAGNOSIS — M542 Cervicalgia: Secondary | ICD-10-CM | POA: Diagnosis not present

## 2023-12-09 DIAGNOSIS — S32010A Wedge compression fracture of first lumbar vertebra, initial encounter for closed fracture: Secondary | ICD-10-CM | POA: Diagnosis not present

## 2023-12-15 ENCOUNTER — Ambulatory Visit
Admission: RE | Admit: 2023-12-15 | Discharge: 2023-12-15 | Disposition: A | Discharge status: Home / Self Care | Source: Ambulatory Visit | Attending: Family Medicine | Admitting: Family Medicine

## 2023-12-15 DIAGNOSIS — I6522 Occlusion and stenosis of left carotid artery: Secondary | ICD-10-CM

## 2023-12-15 DIAGNOSIS — R0989 Other specified symptoms and signs involving the circulatory and respiratory systems: Secondary | ICD-10-CM | POA: Diagnosis not present

## 2023-12-20 DIAGNOSIS — M8000XA Age-related osteoporosis with current pathological fracture, unspecified site, initial encounter for fracture: Secondary | ICD-10-CM | POA: Diagnosis not present

## 2023-12-20 DIAGNOSIS — S32010A Wedge compression fracture of first lumbar vertebra, initial encounter for closed fracture: Secondary | ICD-10-CM | POA: Diagnosis not present

## 2023-12-21 DIAGNOSIS — F331 Major depressive disorder, recurrent, moderate: Secondary | ICD-10-CM | POA: Diagnosis not present

## 2023-12-27 DIAGNOSIS — H02831 Dermatochalasis of right upper eyelid: Secondary | ICD-10-CM | POA: Diagnosis not present

## 2023-12-27 DIAGNOSIS — H02834 Dermatochalasis of left upper eyelid: Secondary | ICD-10-CM | POA: Diagnosis not present

## 2024-01-09 DIAGNOSIS — F419 Anxiety disorder, unspecified: Secondary | ICD-10-CM | POA: Diagnosis not present

## 2024-01-18 DIAGNOSIS — Z1231 Encounter for screening mammogram for malignant neoplasm of breast: Secondary | ICD-10-CM | POA: Diagnosis not present

## 2024-01-18 DIAGNOSIS — R319 Hematuria, unspecified: Secondary | ICD-10-CM | POA: Diagnosis not present

## 2024-01-18 DIAGNOSIS — Z01419 Encounter for gynecological examination (general) (routine) without abnormal findings: Secondary | ICD-10-CM | POA: Diagnosis not present

## 2024-01-18 DIAGNOSIS — Z124 Encounter for screening for malignant neoplasm of cervix: Secondary | ICD-10-CM | POA: Diagnosis not present

## 2024-01-18 DIAGNOSIS — Z1151 Encounter for screening for human papillomavirus (HPV): Secondary | ICD-10-CM | POA: Diagnosis not present

## 2024-01-18 DIAGNOSIS — Z6820 Body mass index (BMI) 20.0-20.9, adult: Secondary | ICD-10-CM | POA: Diagnosis not present

## 2024-01-23 DIAGNOSIS — F419 Anxiety disorder, unspecified: Secondary | ICD-10-CM | POA: Diagnosis not present

## 2024-01-24 DIAGNOSIS — E785 Hyperlipidemia, unspecified: Secondary | ICD-10-CM | POA: Diagnosis not present

## 2024-01-24 DIAGNOSIS — M8000XA Age-related osteoporosis with current pathological fracture, unspecified site, initial encounter for fracture: Secondary | ICD-10-CM | POA: Diagnosis not present

## 2024-01-24 DIAGNOSIS — E538 Deficiency of other specified B group vitamins: Secondary | ICD-10-CM | POA: Diagnosis not present

## 2024-01-24 DIAGNOSIS — E559 Vitamin D deficiency, unspecified: Secondary | ICD-10-CM | POA: Diagnosis not present

## 2024-01-26 DIAGNOSIS — Z720 Tobacco use: Secondary | ICD-10-CM | POA: Diagnosis not present

## 2024-01-26 DIAGNOSIS — S32010A Wedge compression fracture of first lumbar vertebra, initial encounter for closed fracture: Secondary | ICD-10-CM | POA: Diagnosis not present

## 2024-01-26 DIAGNOSIS — E559 Vitamin D deficiency, unspecified: Secondary | ICD-10-CM | POA: Diagnosis not present

## 2024-01-26 DIAGNOSIS — M810 Age-related osteoporosis without current pathological fracture: Secondary | ICD-10-CM | POA: Diagnosis not present

## 2024-01-30 DIAGNOSIS — D485 Neoplasm of uncertain behavior of skin: Secondary | ICD-10-CM | POA: Diagnosis not present

## 2024-01-30 DIAGNOSIS — L570 Actinic keratosis: Secondary | ICD-10-CM | POA: Diagnosis not present

## 2024-01-30 DIAGNOSIS — Z1212 Encounter for screening for malignant neoplasm of rectum: Secondary | ICD-10-CM | POA: Diagnosis not present

## 2024-01-31 DIAGNOSIS — R82998 Other abnormal findings in urine: Secondary | ICD-10-CM | POA: Diagnosis not present

## 2024-01-31 DIAGNOSIS — J441 Chronic obstructive pulmonary disease with (acute) exacerbation: Secondary | ICD-10-CM | POA: Diagnosis not present

## 2024-01-31 DIAGNOSIS — Z0000 Encounter for general adult medical examination without abnormal findings: Secondary | ICD-10-CM | POA: Diagnosis not present

## 2024-02-02 DIAGNOSIS — S32010A Wedge compression fracture of first lumbar vertebra, initial encounter for closed fracture: Secondary | ICD-10-CM | POA: Diagnosis not present

## 2024-02-07 DIAGNOSIS — D225 Melanocytic nevi of trunk: Secondary | ICD-10-CM | POA: Diagnosis not present

## 2024-02-07 DIAGNOSIS — L859 Epidermal thickening, unspecified: Secondary | ICD-10-CM | POA: Diagnosis not present

## 2024-02-07 DIAGNOSIS — D485 Neoplasm of uncertain behavior of skin: Secondary | ICD-10-CM | POA: Diagnosis not present

## 2024-02-07 DIAGNOSIS — L820 Inflamed seborrheic keratosis: Secondary | ICD-10-CM | POA: Diagnosis not present

## 2024-02-07 DIAGNOSIS — Z85828 Personal history of other malignant neoplasm of skin: Secondary | ICD-10-CM | POA: Diagnosis not present

## 2024-02-09 DIAGNOSIS — M810 Age-related osteoporosis without current pathological fracture: Secondary | ICD-10-CM | POA: Diagnosis not present

## 2024-02-27 DIAGNOSIS — F419 Anxiety disorder, unspecified: Secondary | ICD-10-CM | POA: Diagnosis not present

## 2024-02-29 DIAGNOSIS — F411 Generalized anxiety disorder: Secondary | ICD-10-CM | POA: Diagnosis not present

## 2024-02-29 DIAGNOSIS — F419 Anxiety disorder, unspecified: Secondary | ICD-10-CM | POA: Diagnosis not present

## 2024-03-08 DIAGNOSIS — M810 Age-related osteoporosis without current pathological fracture: Secondary | ICD-10-CM | POA: Diagnosis not present

## 2024-03-16 DIAGNOSIS — F17210 Nicotine dependence, cigarettes, uncomplicated: Secondary | ICD-10-CM | POA: Diagnosis not present

## 2024-03-16 DIAGNOSIS — H02831 Dermatochalasis of right upper eyelid: Secondary | ICD-10-CM | POA: Diagnosis not present

## 2024-03-16 DIAGNOSIS — H02832 Dermatochalasis of right lower eyelid: Secondary | ICD-10-CM | POA: Diagnosis not present

## 2024-03-16 DIAGNOSIS — H02834 Dermatochalasis of left upper eyelid: Secondary | ICD-10-CM | POA: Diagnosis not present

## 2024-03-16 DIAGNOSIS — H02835 Dermatochalasis of left lower eyelid: Secondary | ICD-10-CM | POA: Diagnosis not present

## 2024-03-16 DIAGNOSIS — J449 Chronic obstructive pulmonary disease, unspecified: Secondary | ICD-10-CM | POA: Diagnosis not present

## 2024-04-09 DIAGNOSIS — B078 Other viral warts: Secondary | ICD-10-CM | POA: Diagnosis not present

## 2024-04-09 DIAGNOSIS — L570 Actinic keratosis: Secondary | ICD-10-CM | POA: Diagnosis not present

## 2024-04-12 DIAGNOSIS — M810 Age-related osteoporosis without current pathological fracture: Secondary | ICD-10-CM | POA: Diagnosis not present

## 2024-04-18 DIAGNOSIS — F411 Generalized anxiety disorder: Secondary | ICD-10-CM | POA: Diagnosis not present

## 2024-05-15 DIAGNOSIS — M810 Age-related osteoporosis without current pathological fracture: Secondary | ICD-10-CM | POA: Diagnosis not present

## 2024-06-06 DIAGNOSIS — F17210 Nicotine dependence, cigarettes, uncomplicated: Secondary | ICD-10-CM | POA: Diagnosis not present

## 2024-06-06 DIAGNOSIS — R350 Frequency of micturition: Secondary | ICD-10-CM | POA: Diagnosis not present

## 2024-06-06 DIAGNOSIS — R319 Hematuria, unspecified: Secondary | ICD-10-CM | POA: Diagnosis not present

## 2024-06-26 DIAGNOSIS — M810 Age-related osteoporosis without current pathological fracture: Secondary | ICD-10-CM | POA: Diagnosis not present

## 2024-06-27 DIAGNOSIS — F4001 Agoraphobia with panic disorder: Secondary | ICD-10-CM | POA: Diagnosis not present
